# Patient Record
Sex: Female | Born: 1990 | Race: White | Hispanic: No | Marital: Single | Smoking: Never smoker
Health system: Southern US, Community
[De-identification: ages and names within clinical notes are randomized; demographics above are authoritative.]

## PROBLEM LIST (undated history)

## (undated) DIAGNOSIS — J45909 Unspecified asthma, uncomplicated: Secondary | ICD-10-CM

## (undated) HISTORY — PX: NO PAST SURGERIES: SHX2092

---

## 2019-11-10 ENCOUNTER — Emergency Department (HOSPITAL_COMMUNITY)
Admission: EM | Admit: 2019-11-10 | Discharge: 2019-11-10 | Disposition: A | Discharge status: Home / Self Care | Payer: Medicaid Other | Attending: Emergency Medicine | Admitting: Emergency Medicine

## 2019-11-10 ENCOUNTER — Encounter (HOSPITAL_COMMUNITY): Payer: Self-pay | Admitting: Emergency Medicine

## 2019-11-10 ENCOUNTER — Emergency Department (HOSPITAL_COMMUNITY): Payer: Medicaid Other

## 2019-11-10 ENCOUNTER — Other Ambulatory Visit: Payer: Self-pay

## 2019-11-10 DIAGNOSIS — J45909 Unspecified asthma, uncomplicated: Secondary | ICD-10-CM | POA: Insufficient documentation

## 2019-11-10 DIAGNOSIS — N390 Urinary tract infection, site not specified: Secondary | ICD-10-CM | POA: Diagnosis not present

## 2019-11-10 DIAGNOSIS — Z79899 Other long term (current) drug therapy: Secondary | ICD-10-CM | POA: Insufficient documentation

## 2019-11-10 DIAGNOSIS — E876 Hypokalemia: Secondary | ICD-10-CM

## 2019-11-10 DIAGNOSIS — R42 Dizziness and giddiness: Secondary | ICD-10-CM | POA: Diagnosis present

## 2019-11-10 HISTORY — DX: Unspecified asthma, uncomplicated: J45.909

## 2019-11-10 LAB — URINALYSIS, ROUTINE W REFLEX MICROSCOPIC
Bilirubin Urine: NEGATIVE
Glucose, UA: NEGATIVE mg/dL
Hgb urine dipstick: NEGATIVE
Ketones, ur: 5 mg/dL — AB
Nitrite: POSITIVE — AB
Protein, ur: NEGATIVE mg/dL
Specific Gravity, Urine: 1.012 (ref 1.005–1.030)
pH: 8 (ref 5.0–8.0)

## 2019-11-10 LAB — COMPREHENSIVE METABOLIC PANEL
ALT: 13 U/L (ref 0–44)
AST: 19 U/L (ref 15–41)
Albumin: 3.9 g/dL (ref 3.5–5.0)
Alkaline Phosphatase: 65 U/L (ref 38–126)
Anion gap: 13 (ref 5–15)
BUN: 9 mg/dL (ref 6–20)
CO2: 19 mmol/L — ABNORMAL LOW (ref 22–32)
Calcium: 9.5 mg/dL (ref 8.9–10.3)
Chloride: 102 mmol/L (ref 98–111)
Creatinine, Ser: 0.97 mg/dL (ref 0.44–1.00)
GFR calc Af Amer: >60 mL/min (ref >60)
GFR calc non Af Amer: >60 mL/min (ref >60)
Glucose, Bld: 130 mg/dL — ABNORMAL HIGH (ref 70–99)
Potassium: 2.5 mmol/L — CL (ref 3.5–5.1)
Sodium: 134 mmol/L — ABNORMAL LOW (ref 135–145)
Total Bilirubin: 0.2 mg/dL — ABNORMAL LOW (ref 0.3–1.2)
Total Protein: 7.3 g/dL (ref 6.5–8.1)

## 2019-11-10 LAB — CBC
HCT: 34.9 % — ABNORMAL LOW (ref 36.0–46.0)
Hemoglobin: 12.1 g/dL (ref 12.0–15.0)
MCH: 28.5 pg (ref 26.0–34.0)
MCHC: 34.7 g/dL (ref 30.0–36.0)
MCV: 82.1 fL (ref 80.0–100.0)
Platelets: 269 10*3/uL (ref 150–400)
RBC: 4.25 MIL/uL (ref 3.87–5.11)
RDW: 14.3 % (ref 11.5–15.5)
WBC: 8.4 10*3/uL (ref 4.0–10.5)
nRBC: 0 % (ref 0.0–0.2)

## 2019-11-10 LAB — LIPASE, BLOOD: Lipase: 26 U/L (ref 11–51)

## 2019-11-10 LAB — I-STAT BETA HCG BLOOD, ED (MC, WL, AP ONLY): I-stat hCG, quantitative: <5 m[IU]/mL (ref <5)

## 2019-11-10 LAB — MAGNESIUM: Magnesium: 1.8 mg/dL (ref 1.7–2.4)

## 2019-11-10 MED ORDER — LACTATED RINGERS IV BOLUS
1000.0000 mL | Freq: Once | INTRAVENOUS | Status: AC
  Administered 2019-11-10: 1000 mL via INTRAVENOUS

## 2019-11-10 MED ORDER — POTASSIUM CHLORIDE CRYS ER 20 MEQ PO TBCR: 40.0000 meq | EXTENDED_RELEASE_TABLET | Freq: Two times a day (BID) | ORAL | 0 refills | Status: AC

## 2019-11-10 MED ORDER — POTASSIUM CHLORIDE CRYS ER 20 MEQ PO TBCR
40.0000 meq | EXTENDED_RELEASE_TABLET | Freq: Once | ORAL | Status: AC
  Administered 2019-11-10: 40 meq via ORAL
  Filled 2019-11-10: qty 2

## 2019-11-10 MED ORDER — NITROFURANTOIN MONOHYD MACRO 100 MG PO CAPS: 100.0000 mg | ORAL_CAPSULE | Freq: Two times a day (BID) | ORAL | 0 refills | Status: AC

## 2019-11-10 NOTE — ED Triage Notes (Signed)
Patient moaning in triage stating she believes she is dehydrated because her muscles are cramping and now feeling nauseas. Patient also feeling short of breath for the past hour. Patient restless in triage.

## 2019-11-10 NOTE — ED Notes (Signed)
Pt returned from xray

## 2019-11-10 NOTE — ED Notes (Signed)
Pt transported to xray 

## 2019-11-10 NOTE — Discharge Instructions (Signed)
If you develop numbness or weakness, tingling, vomiting, fever, flank/back or abdominal pain, or any other new/concerning symptoms then return to the ER for evaluation.

## 2019-11-10 NOTE — ED Provider Notes (Signed)
[facility] EMERGENCY DEPARTMENT Provider Note   CSN: [id number] Arrival date & time: 11/10/19  1552     History   Chief Complaint Chief Complaint  Patient presents with  . Muscle Cramps  . Shortness of Breath    HPI Sonya Wilson is a 28 y.o. female.     HPI  28 year old female presents with lightheadedness and cramping.  About 30 minutes prior to arrival the patient was feeling lightheaded and feeling like she had a panic attack.  She has a hard time describing exactly what that means of there was some shortness of breath.  That part seems to be resolved.  At the time she also had cramping in her hands and feet bilaterally.  While drinking Gatorade in the waiting room that seems to be resolved as well.  She states she has not eaten and drank well for about a week due to stressors at home.  Thinks she is dehydrated.  Denies any chest pain, headache, focal weakness.  No recent illness otherwise.  No cough.  Past Medical History:  Diagnosis Date  . Asthma     There are no active problems to display for this patient.   History reviewed. No pertinent surgical history.   OB History   No obstetric history on file.      Home Medications    Prior to Admission medications   Medication Sig Start Date End Date Taking? Authorizing Provider  ALPRAZolam Prudy Feeler(XANAX) 0.25 MG tablet Take 0.25 mg by mouth at bedtime as needed for anxiety.   Yes [provider]  lisdexamfetamine (VYVANSE) 50 MG capsule Take 50 mg by mouth daily.   Yes [provider]  SUMAtriptan (IMITREX) 50 MG tablet Take 50 mg by mouth every 2 (two) hours as needed for migraine. May repeat in 2 hours if headache persists or recurs.   Yes [provider]  venlafaxine (EFFEXOR) 75 MG tablet Take 75 mg by mouth 3 (three) times daily with meals.   Yes [provider]  nitrofurantoin, macrocrystal-monohydrate, (MACROBID) 100 MG capsule Take 1 capsule (100 mg total) by  mouth 2 (two) times daily. 11/10/19   Pricilla LovelessGoldston, Elexus Barman, MD  potassium chloride SA (KLOR-CON) 20 MEQ tablet Take 2 tablets (40 mEq total) by mouth 2 (two) times daily for 5 days. 11/10/19 11/15/19  Pricilla LovelessGoldston, Anavi Branscum, MD    Family History No family history on file.  Social History Social History   Tobacco Use  . Smoking status: Never Smoker  . Smokeless tobacco: Never Used  Substance Use Topics  . Alcohol use: Not on file  . Drug use: Not on file     Allergies   Azithromycin   Review of Systems Review of Systems  Constitutional: Negative for fever.  Respiratory: Positive for shortness of breath. Negative for cough.   Cardiovascular: Negative for chest pain.  Gastrointestinal: Negative for abdominal pain, diarrhea and vomiting.  Musculoskeletal: Positive for myalgias.  Neurological: Negative for weakness and numbness.  All other systems reviewed and are negative.    Physical Exam Updated Vital Signs BP 112/84 (BP Location: Left Arm)   Pulse 87   Temp 98.2 F (36.8 C) (Oral)   Resp 16   LMP 11/10/2019   SpO2 100%   Physical Exam Vitals signs and nursing note reviewed.  Constitutional:      General: She is not in acute distress.    Appearance: She is well-developed. She is not ill-appearing or diaphoretic.  HENT:  Head: Normocephalic and atraumatic.     Right Ear: External ear normal.     Left Ear: External ear normal.     Nose: Nose normal.  Eyes:     General:        Right eye: No discharge.        Left eye: No discharge.     Extraocular Movements: Extraocular movements intact.  Cardiovascular:     Rate and Rhythm: Normal rate and regular rhythm.     Heart sounds: Normal heart sounds.  Pulmonary:     Effort: Pulmonary effort is normal.     Breath sounds: Normal breath sounds.  Abdominal:     Palpations: Abdomen is soft.     Tenderness: There is no abdominal tenderness.  Musculoskeletal:     Comments: No focal muscle tenderness in large muscle groups   Skin:    General: Skin is warm and dry.  Neurological:     Mental Status: She is alert.     Comments: CN 3-12 grossly intact. 5/5 strength in all 4 extremities. Grossly normal sensation. Normal finger to nose.   Psychiatric:        Mood and Affect: Mood is not anxious.      ED Treatments / Results  Labs (all labs ordered are listed, but only abnormal results are displayed) Labs Reviewed  COMPREHENSIVE METABOLIC PANEL - Abnormal; Notable for the following components:      Result Value   Sodium 134 (*)    Potassium 2.5 (*)    CO2 19 (*)    Glucose, Bld 130 (*)    Total Bilirubin 0.2 (*)    All other components within normal limits  CBC - Abnormal; Notable for the following components:   HCT 34.9 (*)    All other components within normal limits  URINALYSIS, ROUTINE W REFLEX MICROSCOPIC - Abnormal; Notable for the following components:   APPearance HAZY (*)    Ketones, ur 5 (*)    Nitrite POSITIVE (*)    Leukocytes,Ua SMALL (*)    Bacteria, UA MANY (*)    All other components within normal limits  URINE CULTURE  LIPASE, BLOOD  MAGNESIUM  I-STAT BETA HCG BLOOD, ED ([facility], [facility], [facility] ONLY)    EKG EKG Interpretation  Date/Time:  Wednesday November 10 2019 17:47:52 EST Ventricular Rate:  102 PR Interval:  144 QRS Duration: 78 QT Interval:  354 QTC Calculation: 461 R Axis:   74 Text Interpretation: Sinus tachycardia nonspecific ST/T segments No old tracing to compare Confirmed by Sherwood Gambler [phone]) on 11/10/2019 5:57:57 PM   Radiology Dg Chest 2 View  Result Date: 11/10/2019 CLINICAL DATA:  Shortness of breath EXAM: CHEST - 2 VIEW COMPARISON:  05/14/2016 FINDINGS: The heart size and mediastinal contours are within normal limits. Both lungs are clear. The visualized skeletal structures are unremarkable. IMPRESSION: No acute abnormality of the lungs. Electronically Signed   By: Eddie Candle M.D.   On: 11/10/2019 17:19    Procedures Procedures (including critical care  time)  Medications Ordered in ED Medications  potassium chloride SA (KLOR-CON) CR tablet 40 mEq (40 mEq Oral Given 11/10/19 1814)  lactated ringers bolus 1,000 mL (0 mLs Intravenous Stopped 11/10/19 1946)     Initial Impression / Assessment and Plan / ED Course  I have reviewed the triage vital signs and the nursing notes.  Pertinent labs & imaging results that were available during my care of the patient were reviewed by me and considered in my  medical decision making (see chart for details).        Patient is found to have hypokalemia to 2.5, which could explain the cramps she was experiencing.  She was given IV fluids as well as oral potassium replacement.  QTC is okay.  Magnesium is okay.  Other labs are not significantly abnormal.  She does have concerning urinalysis for possible UTI and will give Macrobid and sent for culture.  Otherwise, she appears stable for discharge home with oral potassium replacement.  My suspicion this is ACS, PE, focal neurologic cause is pretty low.  Return precautions.  Final Clinical Impressions(s) / ED Diagnoses   Final diagnoses:  Hypokalemia  Acute lower UTI (urinary tract infection)    ED Discharge Orders         Ordered    nitrofurantoin, macrocrystal-monohydrate, (MACROBID) 100 MG capsule  2 times daily     11/10/19 1938    potassium chloride SA (KLOR-CON) 20 MEQ tablet  2 times daily     11/10/19 1938           Pricilla Loveless, MD 11/10/19 2045

## 2019-11-13 LAB — URINE CULTURE: Culture: >=100000 — AB

## 2019-11-14 ENCOUNTER — Telehealth: Payer: Self-pay | Admitting: Emergency Medicine

## 2019-11-14 NOTE — Telephone Encounter (Signed)
Post ED Visit - Positive Culture Follow-up  Culture report reviewed by antimicrobial stewardship pharmacist: [place] Team []  Elenor Quinones, Pharm.D. []  Heide Guile, Pharm.D., BCPS AQ-ID []  Parks Neptune, Pharm.D., BCPS []  Alycia Rossetti, Pharm.D., BCPS []  [place], Pharm.D., BCPS, AAHIVP []  Legrand Como, Pharm.D., BCPS, AAHIVP []  Salome Arnt, PharmD, BCPS []  Johnnette Gourd, PharmD, BCPS []  Hughes Better, PharmD, BCPS [x]  Duanne Limerick, PharmD []  Laqueta Linden, PharmD, BCPS []  Albertina Parr, PharmD  [place] Team []  Leodis Sias, PharmD []  Lindell Spar, PharmD []  Royetta Asal, PharmD []  Graylin Shiver, Rph []  Rema Fendt) Glennon Mac, PharmD []  Arlyn Dunning, PharmD []  Netta Cedars, PharmD []  Dia Sitter, PharmD []  Leone Haven, PharmD []  Gretta Arab, PharmD []  Theodis Shove, PharmD []  Peggyann Juba, PharmD []  Reuel Boom, PharmD   Positive urine culture Treated with Nitrofurantoin, organism sensitive to the same and no further patient follow-up is required at this time.  Sandi Raveling Special Ranes 11/14/2019, 3:59 PM

## 2020-03-18 ENCOUNTER — Other Ambulatory Visit: Payer: Self-pay

## 2020-03-18 ENCOUNTER — Inpatient Hospital Stay (HOSPITAL_COMMUNITY)
Admission: AD | Admit: 2020-03-18 | Discharge: 2020-03-18 | Disposition: A | Discharge status: Home / Self Care | Payer: Medicaid Other | Attending: Obstetrics & Gynecology | Admitting: Obstetrics & Gynecology

## 2020-03-18 ENCOUNTER — Encounter (HOSPITAL_COMMUNITY): Payer: Self-pay | Admitting: Obstetrics & Gynecology

## 2020-03-18 ENCOUNTER — Inpatient Hospital Stay (HOSPITAL_COMMUNITY): Payer: Medicaid Other

## 2020-03-18 DIAGNOSIS — O26893 Other specified pregnancy related conditions, third trimester: Secondary | ICD-10-CM | POA: Diagnosis not present

## 2020-03-18 DIAGNOSIS — O209 Hemorrhage in early pregnancy, unspecified: Secondary | ICD-10-CM | POA: Diagnosis not present

## 2020-03-18 DIAGNOSIS — Z881 Allergy status to other antibiotic agents status: Secondary | ICD-10-CM | POA: Diagnosis not present

## 2020-03-18 DIAGNOSIS — O3680X0 Pregnancy with inconclusive fetal viability, not applicable or unspecified: Secondary | ICD-10-CM | POA: Diagnosis not present

## 2020-03-18 DIAGNOSIS — Z3A01 Less than 8 weeks gestation of pregnancy: Secondary | ICD-10-CM

## 2020-03-18 DIAGNOSIS — R197 Diarrhea, unspecified: Secondary | ICD-10-CM | POA: Insufficient documentation

## 2020-03-18 LAB — CBC
HCT: 35.1 % — ABNORMAL LOW (ref 36.0–46.0)
Hemoglobin: 11.6 g/dL — ABNORMAL LOW (ref 12.0–15.0)
MCH: 28 pg (ref 26.0–34.0)
MCHC: 33 g/dL (ref 30.0–36.0)
MCV: 84.8 fL (ref 80.0–100.0)
Platelets: 291 10*3/uL (ref 150–400)
RBC: 4.14 MIL/uL (ref 3.87–5.11)
RDW: 15 % (ref 11.5–15.5)
WBC: 9.4 10*3/uL (ref 4.0–10.5)
nRBC: 0 % (ref 0.0–0.2)

## 2020-03-18 LAB — WET PREP, GENITAL
Clue Cells Wet Prep HPF POC: NONE SEEN
Sperm: NONE SEEN
Trich, Wet Prep: NONE SEEN
Yeast Wet Prep HPF POC: NONE SEEN

## 2020-03-18 LAB — HIV ANTIBODY (ROUTINE TESTING W REFLEX): HIV Screen 4th Generation wRfx: NONREACTIVE

## 2020-03-18 LAB — URINALYSIS, ROUTINE W REFLEX MICROSCOPIC
Bacteria, UA: NONE SEEN
Bilirubin Urine: NEGATIVE
Glucose, UA: NEGATIVE mg/dL
Ketones, ur: NEGATIVE mg/dL
Leukocytes,Ua: NEGATIVE
Nitrite: NEGATIVE
Protein, ur: 30 mg/dL — AB
Specific Gravity, Urine: 1.01 (ref 1.005–1.030)
pH: 6 (ref 5.0–8.0)

## 2020-03-18 LAB — POCT PREGNANCY, URINE: Preg Test, Ur: POSITIVE — AB

## 2020-03-18 LAB — HCG, QUANTITATIVE, PREGNANCY: hCG, Beta Chain, Quant, S: 2151 m[IU]/mL — ABNORMAL HIGH (ref <5)

## 2020-03-18 NOTE — MAU Provider Note (Addendum)
Chief Complaint: Vaginal Bleeding and Diarrhea   First Provider Initiated Contact with Patient 03/18/20 1525      SUBJECTIVE HPI: Sonya Wilson is a 29 y.o. G3P2000 at [redacted]w[redacted]d by LMP who presents to maternity admissions reporting onset of diarrhea early this morning around 4 am followed by vaginal bleeding when wiping.  She did not have an appetite last night for dinner but drank apple and cranberry juice before going to bed. She woke up with her stomach "gurgling" then started the diarrhea. Then, 1-2 hours later while at work, she saw bleeding when wiping that was bright red.  It has not required a pad. There is cramping associated with the bleeding.  The diarrhea has completely resolved prior to arrival in MAU.  There are no other symptoms. She has not tried any treatments.  She denies vaginal bleeding, vaginal itching/burning, urinary symptoms, h/a, dizziness, n/v, or fever/chills.     HPI  Past Medical History:  Diagnosis Date  . Asthma    Past Surgical History:  Procedure Laterality Date  . NO PAST SURGERIES     Social History   Socioeconomic History  . Marital status: Single    Spouse name: Not on file  . Number of children: Not on file  . Years of education: Not on file  . Highest education level: Not on file  Occupational History  . Not on file  Tobacco Use  . Smoking status: Never Smoker  . Smokeless tobacco: Never Used  Substance and Sexual Activity  . Alcohol use: Not Currently  . Drug use: Never  . Sexual activity: Yes    Birth control/protection: None  Other Topics Concern  . Not on file  Social History Narrative  . Not on file   Social Determinants of Health   Financial Resource Strain:   . Difficulty of Paying Living Expenses:   Food Insecurity:   . Worried About Programme researcher, broadcasting/film/video in the Last Year:   . Barista in the Last Year:   Transportation Needs:   . Freight forwarder (Medical):   Marland Kitchen Lack of Transportation (Non-Medical):   Physical  Activity:   . Days of Exercise per Week:   . Minutes of Exercise per Session:   Stress:   . Feeling of Stress :   Social Connections:   . Frequency of Communication with Friends and Family:   . Frequency of Social Gatherings with Friends and Family:   . Attends Religious Services:   . Active Member of Clubs or Organizations:   . Attends Banker Meetings:   Marland Kitchen Marital Status:   Intimate Partner Violence:   . Fear of Current or Ex-Partner:   . Emotionally Abused:   Marland Kitchen Physically Abused:   . Sexually Abused:    No current facility-administered medications on file prior to encounter.   Current Outpatient Medications on File Prior to Encounter  Medication Sig Dispense Refill  . lisdexamfetamine (VYVANSE) 50 MG capsule Take 50 mg by mouth daily.    . nitrofurantoin, macrocrystal-monohydrate, (MACROBID) 100 MG capsule Take 1 capsule (100 mg total) by mouth 2 (two) times daily. 10 capsule 0  . potassium chloride SA (KLOR-CON) 20 MEQ tablet Take 2 tablets (40 mEq total) by mouth 2 (two) times daily for 5 days. 20 tablet 0  . SUMAtriptan (IMITREX) 50 MG tablet Take 50 mg by mouth every 2 (two) hours as needed for migraine. May repeat in 2 hours if headache persists or recurs.    Marland Kitchen  venlafaxine (EFFEXOR) 75 MG tablet Take 75 mg by mouth 3 (three) times daily with meals.     Allergies  Allergen Reactions  . Azithromycin Hives    ROS:  Review of Systems  Constitutional: Negative for chills and fever.  Respiratory: Negative for cough and shortness of breath.   Cardiovascular: Negative for chest pain.  Gastrointestinal: Positive for diarrhea. Negative for constipation, nausea and vomiting.  Genitourinary: Positive for vaginal bleeding. Negative for dysuria, frequency and urgency.  Musculoskeletal: Negative.   Neurological: Negative for dizziness and headaches.     I have reviewed patient's Past Medical Hx, Surgical Hx, Family Hx, Social Hx, medications and allergies.    Physical Exam   Patient Vitals for the past 24 hrs:  BP Temp Temp src Pulse Resp SpO2 Height Weight  03/18/20 1842 -- -- -- -- 16 -- -- --  03/18/20 1432 118/73 98.7 F (37.1 C) Oral 80 16 100 % 5\' 2"  (1.575 m) 68.8 kg   Constitutional: Well-developed, well-nourished female in no acute distress.  Cardiovascular: normal rate Respiratory: normal effort GI: Abd soft, non-tender. Pos BS x 4 MS: Extremities nontender, no edema, normal ROM Neurologic: Alert and oriented x 4.  GU: Neg CVAT.  PELVIC EXAM: Cervix pink, visually closed, without lesion, scant dark red bleeding with one small clot, vaginal walls and external genitalia normal Bimanual exam: Cervix 0/long/high, firm, anterior, neg CMT, uterus nontender, nonenlarged, adnexa without tenderness, enlargement, or mass   LAB RESULTS Results for orders placed or performed during the hospital encounter of 03/18/20 (from the past 24 hour(s))  Pregnancy, urine POC     Status: Abnormal   Collection Time: 03/18/20  2:23 PM  Result Value Ref Range   Preg Test, Ur POSITIVE (A) NEGATIVE  Urinalysis, Routine w reflex microscopic     Status: Abnormal   Collection Time: 03/18/20  2:24 PM  Result Value Ref Range   Color, Urine YELLOW YELLOW   APPearance CLEAR CLEAR   Specific Gravity, Urine 1.010 1.005 - 1.030   pH 6.0 5.0 - 8.0   Glucose, UA NEGATIVE NEGATIVE mg/dL   Hgb urine dipstick LARGE (A) NEGATIVE   Bilirubin Urine NEGATIVE NEGATIVE   Ketones, ur NEGATIVE NEGATIVE mg/dL   Protein, ur 30 (A) NEGATIVE mg/dL   Nitrite NEGATIVE NEGATIVE   Leukocytes,Ua NEGATIVE NEGATIVE   RBC / HPF 21-50 0 - 5 RBC/hpf   WBC, UA 6-10 0 - 5 WBC/hpf   Bacteria, UA NONE SEEN NONE SEEN   Squamous Epithelial / LPF 0-5 0 - 5   Mucus PRESENT   Wet prep, genital     Status: Abnormal   Collection Time: 03/18/20  3:35 PM   Specimen: Vaginal  Result Value Ref Range   Yeast Wet Prep HPF POC NONE SEEN NONE SEEN   Trich, Wet Prep NONE SEEN NONE SEEN    Clue Cells Wet Prep HPF POC NONE SEEN NONE SEEN   WBC, Wet Prep HPF POC FEW (A) NONE SEEN   Sperm NONE SEEN   CBC     Status: Abnormal   Collection Time: 03/18/20  5:12 PM  Result Value Ref Range   WBC 9.4 4.0 - 10.5 K/uL   RBC 4.14 3.87 - 5.11 MIL/uL   Hemoglobin 11.6 (L) 12.0 - 15.0 g/dL   HCT 03/20/20 (L) 28.7 - 86.7 %   MCV 84.8 80.0 - 100.0 fL   MCH 28.0 26.0 - 34.0 pg   MCHC 33.0 30.0 - 36.0 g/dL   RDW  15.0 11.5 - 15.5 %   Platelets 291 150 - 400 K/uL   nRBC 0.0 0.0 - 0.2 %  hCG, quantitative, pregnancy     Status: Abnormal   Collection Time: 03/18/20  5:12 PM  Result Value Ref Range   hCG, Beta Chain, Quant, S 2,151 (H) <5 mIU/mL  HIV Antibody (routine testing w rflx)     Status: None   Collection Time: 03/18/20  5:12 PM  Result Value Ref Range   HIV Screen 4th Generation wRfx NON REACTIVE NON REACTIVE  ABO/Rh     Status: None   Collection Time: 03/18/20  5:12 PM  Result Value Ref Range   ABO/RH(D)      O POS Performed at [facility], [address] [place], [place] [address]     --/--/O POS Performed at [facility], [address] [place], [place] [address]  [phone])  IMAGING US OB LESS THAN 14 WEEKS WITH OB TRANSVAGINAL  Result Date: 03/18/2020 CLINICAL DATA:  29 year old pregnant female presenting with vaginal bleeding. LMP: 02/08/2020 corresponding to an estimated gestational age of [redacted] weeks, 4 days. EXAM: OBSTETRIC <14 WK [place] AND TRANSVAGINAL OB US TECHNIQUE: Both transabdominal and transvaginal ultrasound examinations were performed for complete evaluation of the gestation as well as the maternal uterus, adnexal regions, and pelvic cul-de-sac. Transvaginal technique was performed to assess early pregnancy. COMPARISON:  None. FINDINGS: There is a sac-like structure within the right fundal endometrium. No yolk sac or fetal pole identified within this structure. There is apparent decidual reaction around this structure. This likely represents an early  gestational sac. A blighted ovum or a pseudo gestation of an ectopic pregnancy not entirely excluded clinical correlation and follow-up recommended. If this cystic structure is a true gestational sac, the estimated gestational age based on mean sac diameter of 5 mm is 5 weeks, 1 day. Subchorionic hemorrhage:  None visualized. Maternal uterus/adnexae: The ovaries are unremarkable. There is a corpus luteum in the right ovary. IMPRESSION: Small sac-like structure within the endometrium, likely an early gestational sac. Clinical correlation and follow-up with ultrasound in 7-11 days, or earlier if clinically indicated, recommended. Electronically Signed   By: Elgie Collard M.D.   On: 03/18/2020 16:38    MAU Management/MDM: Orders Placed This Encounter  Procedures  . Wet prep, genital  . US OB LESS THAN 14 WEEKS WITH OB TRANSVAGINAL  . Urinalysis, Routine w reflex microscopic  . CBC  . hCG, quantitative, pregnancy  . HIV Antibody (routine testing w rflx)  . Contact Isolation: Enteric  . Pregnancy, urine POC  . ABO/Rh  . Discharge patient    No orders of the defined types were placed in this encounter.   Findings today could represent a normal early pregnancy, spontaneous abortion or ectopic pregnancy which can be life-threatening.  Ectopic precautions were given to the patient with plan to return in 48 hours for repeat quant hcg to evaluate pregnancy development.  Appt made at [facility] for stat hcg in 2 days.  Pt discharged with strict ectopic/bleeding precautions.  ASSESSMENT 1. Pregnancy of unknown anatomic location   2. Vaginal bleeding in pregnancy, first trimester     PLAN Discharge home Allergies as of 03/18/2020      Reactions   Azithromycin Hives      Medication List    STOP taking these medications   ALPRAZolam 0.25 MG tablet Commonly known as: XANAX     TAKE these medications   lisdexamfetamine 50 MG  capsule Commonly known as: VYVANSE Take 50 mg by mouth daily.    nitrofurantoin (macrocrystal-monohydrate) 100 MG capsule Commonly known as: MACROBID Take 1 capsule (100 mg total) by mouth 2 (two) times daily.   potassium chloride SA 20 MEQ tablet Commonly known as: KLOR-CON Take 2 tablets (40 mEq total) by mouth 2 (two) times daily for 5 days.   SUMAtriptan 50 MG tablet Commonly known as: IMITREX Take 50 mg by mouth every 2 (two) hours as needed for migraine. May repeat in 2 hours if headache persists or recurs.   venlafaxine 75 MG tablet Commonly known as: EFFEXOR Take 75 mg by mouth 3 (three) times daily with meals.      Follow-up [place] for [facility] Follow up.   Specialty: Obstetrics and Gynecology Why: On Monday 03/21/19 at 2 pm.  Return to MAU for worsening symptoms.  Contact information: [address], [place] [id number] [place] [id number] [facility] Follow up.   Specialty: Obstetrics and Gynecology Why: For emergencies.  Call as needed for results. Contact information: [address] [id number] [place]          Fatima Blank Certified Nurse-Midwife 03/18/2020  7:11 PM

## 2020-03-18 NOTE — MAU Note (Signed)
Sonya Wilson is a 29 y.o. at [redacted]w[redacted]d here in MAU reporting: woke up around 0300 and her stomach was gurgling and she had diarrhea. States it has "died off a tad". When she was wiping she saw bleeding. States at first she just saw the blood when she wiping but then when she was at work she could see the blood coming out. intermittent cramping, none currently.   LMP: 02/08/20  Onset of complaint: today  Pain score: 0/10  Vitals:   03/18/20 1432  BP: 118/73  Pulse: 80  Resp: 16  Temp: 98.7 F (37.1 C)  SpO2: 100%     Lab orders placed from triage: UA, UPT

## 2020-03-20 ENCOUNTER — Other Ambulatory Visit: Payer: Self-pay

## 2020-03-20 ENCOUNTER — Ambulatory Visit: Payer: Medicaid Other | Admitting: *Deleted

## 2020-03-20 VITALS — BP 109/64 | HR 103 | Temp 98.5°F | Ht 62.0 in | Wt 151.0 lb

## 2020-03-20 DIAGNOSIS — O3680X0 Pregnancy with inconclusive fetal viability, not applicable or unspecified: Secondary | ICD-10-CM

## 2020-03-20 LAB — GC/CHLAMYDIA PROBE AMP (~~LOC~~) NOT AT ARMC
Chlamydia: NEGATIVE
Comment: NEGATIVE
Comment: NORMAL
Neisseria Gonorrhea: NEGATIVE

## 2020-03-20 LAB — BETA HCG QUANT (REF LAB): hCG Quant: 3227 m[IU]/mL

## 2020-03-20 LAB — ABO/RH: ABO/RH(D): O POS

## 2020-03-20 NOTE — Progress Notes (Signed)
Pt here for f/u of pregnancy of unknown anatomic location after MAU visit on 4/17. She reports having less diarrhea. She is having a small amount of red/brown vaginal discharge. She denies abdominal pain except when she is having an episode of diarrhea. Stat BHCG drawn. Pt advised that she will be called with results today - in approximately 2 hrs. Py voiced understanding and stated that a detailed message can be left on her VM or she can receive results via MyChart.    1700  BHCG results received from lab - 3227. Dr. Debroah Loop informed of results. He recommends pt to have repeat stat BHCG on 4/21. Future [place] may be needed based on those results. Pt was called and informed of results and plan of care. She agreed and voiced understanding. Pt will be notified of appt on 4/21 via MyChart.

## 2020-03-22 ENCOUNTER — Other Ambulatory Visit: Payer: Self-pay

## 2020-03-22 ENCOUNTER — Ambulatory Visit (INDEPENDENT_AMBULATORY_CARE_PROVIDER_SITE_OTHER): Payer: Medicaid Other | Admitting: *Deleted

## 2020-03-22 VITALS — BP 117/73 | HR 83

## 2020-03-22 DIAGNOSIS — O209 Hemorrhage in early pregnancy, unspecified: Secondary | ICD-10-CM

## 2020-03-22 DIAGNOSIS — O3680X0 Pregnancy with inconclusive fetal viability, not applicable or unspecified: Secondary | ICD-10-CM

## 2020-03-22 LAB — BETA HCG QUANT (REF LAB): hCG Quant: 5023 m[IU]/mL

## 2020-03-22 NOTE — Progress Notes (Signed)
Lab drawn for stat BHCG.  Pt reports she has continued to have some vaginal spotting - mostly brown, occasionally is red. She also continues to have occasional mild cramping. Pt advised she will be called with results and plan of care later today. She voiced understanding and stated that a detailed message can be left on her VM if she is not able to answer the call.   1630  BHCG results (5023) reviewed w/Dr. Constant. She recommends follow up US next week. I called pt and left message of her hormone result. I stated that even though the level did not double, it has still increased. She has been scheduled for [place] on 4/27 @ 1100 and will receive the results on the same Florena Kozma. Pt was reminded to go to MAU for evaluation if her bleeding becomes heavy or if she has increased abdominal/pelvic pain. She may call back or send a MyChart message if she has questions.

## 2020-03-23 NOTE — Progress Notes (Signed)
Patient seen and assessed by nursing staff during this encounter. I have reviewed the chart and agree with the documentation and plan. I have also made any necessary editorial changes.  Ladora Osterberg, MD 03/23/2020 8:30 AM    

## 2020-03-24 ENCOUNTER — Other Ambulatory Visit: Payer: Self-pay | Admitting: Obstetrics and Gynecology

## 2020-03-24 MED ORDER — BUTALBITAL-APAP-CAFFEINE 50-325-40 MG PO CAPS: 1.0000 | ORAL_CAPSULE | Freq: Four times a day (QID) | ORAL | 3 refills | Status: AC | PRN

## 2020-03-27 ENCOUNTER — Inpatient Hospital Stay (HOSPITAL_COMMUNITY)
Admission: AD | Admit: 2020-03-27 | Discharge: 2020-03-27 | Disposition: A | Discharge status: Home / Self Care | Payer: Medicaid Other | Attending: Obstetrics & Gynecology | Admitting: Obstetrics & Gynecology

## 2020-03-27 ENCOUNTER — Inpatient Hospital Stay (HOSPITAL_COMMUNITY): Payer: Medicaid Other

## 2020-03-27 ENCOUNTER — Other Ambulatory Visit: Payer: Self-pay

## 2020-03-27 ENCOUNTER — Encounter (HOSPITAL_COMMUNITY): Payer: Self-pay | Admitting: Obstetrics & Gynecology

## 2020-03-27 DIAGNOSIS — O209 Hemorrhage in early pregnancy, unspecified: Secondary | ICD-10-CM | POA: Diagnosis not present

## 2020-03-27 DIAGNOSIS — Z881 Allergy status to other antibiotic agents status: Secondary | ICD-10-CM | POA: Diagnosis not present

## 2020-03-27 DIAGNOSIS — Z6790 Unspecified blood type, Rh positive: Secondary | ICD-10-CM

## 2020-03-27 DIAGNOSIS — Z3490 Encounter for supervision of normal pregnancy, unspecified, unspecified trimester: Secondary | ICD-10-CM

## 2020-03-27 DIAGNOSIS — Z3A01 Less than 8 weeks gestation of pregnancy: Secondary | ICD-10-CM | POA: Diagnosis not present

## 2020-03-27 DIAGNOSIS — O4690 Antepartum hemorrhage, unspecified, unspecified trimester: Secondary | ICD-10-CM

## 2020-03-27 DIAGNOSIS — O3680X0 Pregnancy with inconclusive fetal viability, not applicable or unspecified: Secondary | ICD-10-CM

## 2020-03-27 LAB — URINALYSIS, ROUTINE W REFLEX MICROSCOPIC
Bilirubin Urine: NEGATIVE
Glucose, UA: NEGATIVE mg/dL
Ketones, ur: NEGATIVE mg/dL
Nitrite: NEGATIVE
Protein, ur: 100 mg/dL — AB
Specific Gravity, Urine: 1.024 (ref 1.005–1.030)
pH: 6 (ref 5.0–8.0)

## 2020-03-27 LAB — HCG, QUANTITATIVE, PREGNANCY: hCG, Beta Chain, Quant, S: 14608 m[IU]/mL — ABNORMAL HIGH (ref <5)

## 2020-03-27 NOTE — MAU Note (Signed)
Pt presents to MAU with c/o vaginal bleeding that has been intermittent x 1 week. She describes it as spotting, but today she had a clot with bright red bleeding. She denies pain.

## 2020-03-27 NOTE — MAU Provider Note (Signed)
History     CSN: [id number]  Arrival date and time: 03/27/20 0093   First Provider Initiated Contact with Patient 03/27/20 1053      Chief Complaint  Patient presents with  . Vaginal Bleeding   Ms. Sonya Wilson is a 29 y.o. G3P2002 at [redacted]w[redacted]d who presents to MAU for vaginal bleeding which began one week ago and is intermittent. Patient reports she was here in MAU one week ago for vaginal bleeding and diarrhea. Patient reports she only saw bleeding when she wiped when she initially came to MAU and denies clotting. Patient reports this past Saturday and Sunday she had no bleeding, but this morning she went to use the bathroom and "felt something come out" found a small clot that she brought with her to MAU. Patient reports bleeding on arrival to MAU. Patient reports she was being followed for hCG that was 2151 --> 3227 --> 5023. Patient is scheduled for an [place] tomorrow.  Passing blood clots? yes, per above Blood soaking clothes? no Lightheaded/dizzy? no Significant pelvic pain or cramping? no Passed any tissue? no  Current pregnancy problems? Pt has not yet been seen Blood Type? O Positive Allergies? Azithromycin Current medications? Fioricet PRN, Effexor Current PNC & next appt? Pt requests list of OB Providers  Pt denies vaginal discharge/odor/itching. Pt denies N/V, abdominal pain, constipation, diarrhea, or urinary problems. Pt denies fever, chills, fatigue, sweating or changes in appetite. Pt denies SOB or chest pain. Pt denies dizziness, HA, light-headedness, weakness.   OB History    Gravida  3   Para  2   Term  2   Preterm      AB      Living  2     SAB      TAB      Ectopic      Multiple      Live Births  2           Past Medical History:  Diagnosis Date  . Asthma     Past Surgical History:  Procedure Laterality Date  . NO PAST SURGERIES      Family History  Problem Relation Age of Onset  . COPD Mother   . Cancer Mother     Social  History   Tobacco Use  . Smoking status: Never Smoker  . Smokeless tobacco: Never Used  Substance Use Topics  . Alcohol use: Not Currently  . Drug use: Never    Allergies:  Allergies  Allergen Reactions  . Azithromycin Hives    Medications Prior to Admission  Medication Sig Dispense Refill Last Dose  . Butalbital-APAP-Caffeine 50-325-40 MG capsule Take 1-2 capsules by mouth every 6 (six) hours as needed for headache. 30 capsule 3 Past Week at Unknown time  . venlafaxine (EFFEXOR) 75 MG tablet Take 75 mg by mouth 3 (three) times daily with meals.   Past Week at Unknown time  . lisdexamfetamine (VYVANSE) 50 MG capsule Take 50 mg by mouth daily.     . nitrofurantoin, macrocrystal-monohydrate, (MACROBID) 100 MG capsule Take 1 capsule (100 mg total) by mouth 2 (two) times daily. 10 capsule 0   . potassium chloride SA (KLOR-CON) 20 MEQ tablet Take 2 tablets (40 mEq total) by mouth 2 (two) times daily for 5 days. 20 tablet 0   . SUMAtriptan (IMITREX) 50 MG tablet Take 50 mg by mouth every 2 (two) hours as needed for migraine. May repeat in 2 hours if headache persists or recurs.  Review of Systems  Constitutional: Negative for chills, diaphoresis, fatigue and fever.  Eyes: Negative for visual disturbance.  Respiratory: Negative for shortness of breath.   Cardiovascular: Negative for chest pain.  Gastrointestinal: Negative for abdominal pain, constipation, diarrhea, nausea and vomiting.  Genitourinary: Positive for vaginal bleeding. Negative for dysuria, flank pain, frequency, pelvic pain, urgency and vaginal discharge.  Neurological: Negative for dizziness, weakness, light-headedness and headaches.   Physical Exam   Blood pressure 110/63, pulse 92, temperature 98.2 F (36.8 C), temperature source Oral, resp. rate 18, weight 70.8 kg, last menstrual period 02/08/2020, SpO2 100 %.  Patient Vitals for the past 24 hrs:  BP Temp Temp src Pulse Resp SpO2 Weight  03/27/20 1003 110/63  98.2 F (36.8 C) Oral 92 18 100 % 70.8 kg   Physical Exam  Constitutional: She is oriented to person, place, and time. She appears well-developed and well-nourished. No distress.  HENT:  Head: Normocephalic and atraumatic.  Respiratory: Effort normal.  Neurological: She is alert and oriented to person, place, and time.  Skin: She is not diaphoretic.  Psychiatric: She has a normal mood and affect. Her behavior is normal. Judgment and thought content normal.  Patient declines pelvic exam.  Results for orders placed or performed during the hospital encounter of 03/27/20 (from the past 24 hour(s))  Urinalysis, Routine w reflex microscopic     Status: Abnormal   Collection Time: 03/27/20 10:00 AM  Result Value Ref Range   Color, Urine AMBER (A) YELLOW   APPearance HAZY (A) CLEAR   Specific Gravity, Urine 1.024 1.005 - 1.030   pH 6.0 5.0 - 8.0   Glucose, UA NEGATIVE NEGATIVE mg/dL   Hgb urine dipstick LARGE (A) NEGATIVE   Bilirubin Urine NEGATIVE NEGATIVE   Ketones, ur NEGATIVE NEGATIVE mg/dL   Protein, ur 588 (A) NEGATIVE mg/dL   Nitrite NEGATIVE NEGATIVE   Leukocytes,Ua TRACE (A) NEGATIVE  hCG, quantitative, pregnancy     Status: Abnormal   Collection Time: 03/27/20 10:38 AM  Result Value Ref Range   hCG, Beta Chain, Quant, S 14,608 (H) <5 mIU/mL   US OB Transvaginal  Result Date: 03/27/2020 CLINICAL DATA:  Vaginal bleeding for 1 week EXAM: TRANSVAGINAL OB ULTRASOUND TECHNIQUE: Transvaginal ultrasound was performed for complete evaluation of the gestation as well as the maternal uterus, adnexal regions, and pelvic cul-de-sac. COMPARISON:  03/18/2020 FINDINGS: Intrauterine gestational sac: Single Yolk sac:  Not visualized Embryo:  Possible early embryo. Cardiac Activity: Not visualized Heart Rate:  bpm MSD:   mm    w     d CRL:   4.2 mm   6 w 1 d                  [place] EDC: 11/19/2020 Subchorionic hemorrhage:  None visualized. Maternal uterus/adnexae: No adnexal mass or free fluid.  IMPRESSION: Intrauterine gestation with probable early fetal pole, 6 weeks 1 day by crown-rump length. No fetal heart tones detected currently, likely related to early gestational age. This could be followed with repeat ultrasound in 10-14 days to ensure expected progression. Electronically Signed   By: Charlett Nose M.D.   On: 03/27/2020 11:44   US OB LESS THAN 14 WEEKS WITH OB TRANSVAGINAL  Result Date: 03/18/2020 CLINICAL DATA:  29 year old pregnant female presenting with vaginal bleeding. LMP: 02/08/2020 corresponding to an estimated gestational age of [redacted] weeks, 4 days. EXAM: OBSTETRIC <14 WK [place] AND TRANSVAGINAL OB US TECHNIQUE: Both transabdominal and transvaginal ultrasound examinations were performed for complete evaluation  of the gestation as well as the maternal uterus, adnexal regions, and pelvic cul-de-sac. Transvaginal technique was performed to assess early pregnancy. COMPARISON:  None. FINDINGS: There is a sac-like structure within the right fundal endometrium. No yolk sac or fetal pole identified within this structure. There is apparent decidual reaction around this structure. This likely represents an early gestational sac. A blighted ovum or a pseudo gestation of an ectopic pregnancy not entirely excluded clinical correlation and follow-up recommended. If this cystic structure is a true gestational sac, the estimated gestational age based on mean sac diameter of 5 mm is 5 weeks, 1 day. Subchorionic hemorrhage:  None visualized. Maternal uterus/adnexae: The ovaries are unremarkable. There is a corpus luteum in the right ovary. IMPRESSION: Small sac-like structure within the endometrium, likely an early gestational sac. Clinical correlation and follow-up with ultrasound in 7-11 days, or earlier if clinically indicated, recommended. Electronically Signed   By: Elgie Collard M.D.   On: 03/18/2020 16:38   MAU Course  Procedures  MDM -rising hCG in setting of continued, intermittent vaginal  bleeding -RH positive -UA: amber/hazy/lg hgb/100PRO/trace leuks, sending urine for culture -hCG: 14,608 -[place]: single IUP, no FHT, CRL [redacted]w[redacted]d -CBC WNL 03/18/2020 -WetPrep/GC/CT WNL on 03/18/2020 -normal pelvic exam on 03/18/2020 -pt discharged to home in stable condition  Orders Placed This Encounter  Procedures  . Culture, OB Urine    Standing Status:   Standing    Number of Occurrences:   1  . US OB Transvaginal    Standing Status:   Standing    Number of Occurrences:   1    Order Specific Question:   Symptom/Reason for Exam    Answer:   Vaginal bleeding in pregnancy [[id number]]  . US OB LESS THAN 14 WEEKS WITH OB TRANSVAGINAL    Please schedule patient for follow-up US, Monday - Thursday between 8:00 am - 10:00 am and 12:00 pm - 3:00 pm. The patient will follow-up with [facility] immediately after [place] for results.    Standing Status:   Future    Standing Expiration Date:   05/27/2021    Order Specific Question:   Reason for Exam (SYMPTOM  OR DIAGNOSIS REQUIRED)    Answer:   viability    Order Specific Question:   Preferred Imaging Location?    Answer:   [facility]  . Urinalysis, Routine w reflex microscopic    Standing Status:   Standing    Number of Occurrences:   1  . hCG, quantitative, pregnancy    Standing Status:   Standing    Number of Occurrences:   1  . Discharge patient    Order Specific Question:   Discharge disposition    Answer:   01-Home or Self Care [1]    Order Specific Question:   Discharge patient date    Answer:   03/27/2020   No orders of the defined types were placed in this encounter.  Assessment and Plan   1. Intrauterine pregnancy   2. Vaginal bleeding in pregnancy   3. Blood type, Rh positive   4. Pregnancy with inconclusive fetal viability, single or unspecified fetus    Allergies as of 03/27/2020      Reactions   Azithromycin Hives      Medication List    TAKE these medications   Butalbital-APAP-Caffeine 50-325-40 MG capsule Take 1-2  capsules by mouth every 6 (six) hours as needed for headache.   lisdexamfetamine 50 MG capsule Commonly known as: VYVANSE Take 50  mg by mouth daily.   nitrofurantoin (macrocrystal-monohydrate) 100 MG capsule Commonly known as: MACROBID Take 1 capsule (100 mg total) by mouth 2 (two) times daily.   potassium chloride SA 20 MEQ tablet Commonly known as: KLOR-CON Take 2 tablets (40 mEq total) by mouth 2 (two) times daily for 5 days.   SUMAtriptan 50 MG tablet Commonly known as: IMITREX Take 50 mg by mouth every 2 (two) hours as needed for migraine. May repeat in 2 hours if headache persists or recurs.   venlafaxine 75 MG tablet Commonly known as: EFFEXOR Take 75 mg by mouth 3 (three) times daily with meals.      -will call with culture results, if positive -pt advised to call prescriber of Effexor and Sumatriptan to discuss use in pregnancy and/or ask for early visit with OB provider of choice -list of OB providers given -safe meds in pregnancy list given -viability [place] in 10-14 days -s/sx of miscarriage vs. [facility] given -return MAU precautions given -pt discharged to home in stable condition  Elmyra Ricks E Asanti Craigo 03/27/2020, 1:30 PM

## 2020-03-27 NOTE — Discharge Instructions (Signed)
Safe Medications in Pregnancy    Acne: Benzoyl Peroxide Salicylic Acid  Backache/Headache: Tylenol: 2 regular strength every 4 hours OR              2 Extra strength every 6 hours  Colds/Coughs/Allergies: Benadryl (alcohol free) 25 mg every 6 hours as needed Breath right strips Claritin Cepacol throat lozenges Chloraseptic throat spray Cold-Eeze- up to three times per day Cough drops, alcohol free Flonase (by prescription only) Guaifenesin Mucinex Robitussin DM (plain only, alcohol free) Saline nasal spray/drops Sudafed (pseudoephedrine) & Actifed ** use only after [redacted] weeks gestation and if you do not have high blood pressure Tylenol Vicks Vaporub Zinc lozenges Zyrtec   Constipation: Colace Ducolax suppositories Fleet enema Glycerin suppositories Metamucil Milk of magnesia Miralax Senokot Smooth move tea  Diarrhea: Kaopectate Imodium A-D  *NO pepto Bismol  Hemorrhoids: Anusol Anusol HC Preparation H Tucks  Indigestion: Tums Maalox Mylanta Zantac  Pepcid  Insomnia: Benadryl (alcohol free)  every 6 hours as needed Tylenol PM Unisom, no Gelcaps  Leg Cramps: Tums MagGel  Nausea/Vomiting:  Bonine Dramamine Emetrol Ginger extract Sea bands Meclizine  Nausea medication to take during pregnancy:  Unisom (doxylamine succinate 25 mg tablets) Take one tablet daily at bedtime. If symptoms are not adequately controlled, the dose can be increased to a maximum recommended dose of two tablets daily (1/2 tablet in the morning, 1/2 tablet mid-afternoon and one at bedtime). Vitamin B6  tablets. Take one tablet twice a day (up to 200 mg per day).  Skin Rashes: Aveeno products Benadryl cream or  every 6 hours as needed Calamine Lotion 1% cortisone cream  Yeast infection: Gyne-lotrimin 7 Monistat 7   **If taking multiple medications, please check labels to avoid duplicating the same active ingredients **take  medication as directed on the label ** Do not exceed 4000 mg of tylenol in 24 hours **Do not take medications that contain aspirin or ibuprofen      [facility]     Phone: [phone]  [facility] [place]  Phone: [phone]  [facility] [place]  Phone: [phone]  [facility] [place]                           Phone: [phone]  [facility]          Phone: [phone]  [facility]    Phone: [phone]   [facility])    Phone: [phone]  [facility]    Phone: [phone]  [facility]    Phone: [phone]  [facility]    Phone: [phone]  [facility]  Phone: [phone]  [facility]               Phone: [phone]  [facility] [place]   Phone: [phone]  [facility]    Phone: [phone]                  Vaginal Bleeding During Pregnancy, First Trimester  A small amount of bleeding from the vagina (spotting) is relatively common during early pregnancy. It usually stops on its own. Various things may cause bleeding or spotting during early pregnancy. Some bleeding may be related to the pregnancy, and some may not. In many cases, the bleeding is  normal and is not a problem. However, bleeding can also be a sign of something serious. Be sure to tell your health care provider about any vaginal bleeding right away. Some possible causes of vaginal bleeding during the first trimester include:  Infection or inflammation of the cervix.  Growths (polyps) on the cervix.  Miscarriage or threatened miscarriage.  Pregnancy tissue developing outside of the uterus (ectopic pregnancy).  A mass of  tissue developing in the uterus due to an egg being fertilized incorrectly (molar pregnancy). Follow these instructions at home: Activity  Follow instructions from your health care provider about limiting your activity. Ask what activities are safe for you.  If needed, make plans for someone to help with your regular activities.  Do not have sex or orgasms until your health care provider says that this is safe. General instructions  Take over-the-counter and prescription medicines only as told by your health care provider.  Pay attention to any changes in your symptoms.  Do not use tampons or douche.  Write down how many pads you use each day, how often you change pads, and how soaked (saturated) they are.  If you pass any tissue from your vagina, save the tissue so you can show it to your health care provider.  Keep all follow-up visits as told by your health care provider. This is important. Contact a health care provider if:  You have vaginal bleeding during any part of your pregnancy.  You have cramps or labor pains.  You have a fever. Get help right away if:  You have severe cramps in your back or abdomen.  You pass large clots or a large amount of tissue from your vagina.  Your bleeding increases.  You feel light-headed or weak, or you faint.  You have chills.  You are leaking fluid or have a gush of fluid from your vagina. Summary  A small amount of bleeding (spotting) from the vagina is relatively common during early pregnancy.  Various things may cause bleeding or spotting in early pregnancy.  Be sure to tell your health care provider about any vaginal bleeding right away. This information is not intended to replace advice given to you by your health care provider. Make sure you discuss any questions you have with your health care provider. Document Revised: 03/09/2019 Document Reviewed: 02/20/2017 Elsevier Patient Education  [place] Hematoma  A subchorionic hematoma is a gathering of blood between the outer wall of the embryo (chorion) and the inner wall of the womb (uterus). This condition can cause vaginal bleeding. If they cause little or no vaginal bleeding, early small hematomas usually shrink on their own and do not affect your baby or pregnancy. When bleeding starts later in pregnancy, or if the hematoma is larger or occurs in older pregnant women, the condition may be more serious. Larger hematomas may get bigger, which increases the chances of miscarriage. This condition also increases the risk of:  Premature separation of the placenta from the uterus.  Premature (preterm) labor.  Stillbirth. What are the causes? The exact cause of this condition is not known. It occurs when blood is trapped between the placenta and the uterine wall because the placenta has separated from the original site of implantation. What increases the risk? You are more likely to develop this condition if:  You were treated with fertility medicines.  You conceived through in vitro fertilization (IVF). What are the signs or symptoms? Symptoms of  this condition include:  Vaginal spotting or bleeding.  Contractions of the uterus. These cause abdominal pain. Sometimes you may have no symptoms and the bleeding may only be seen when ultrasound images are taken (transvaginal ultrasound). How is this diagnosed? This condition is diagnosed based on a physical exam. This includes a pelvic exam. You may also have other tests, including:  Blood tests.  Urine tests.  Ultrasound of the abdomen. How is this treated? Treatment for this condition can vary. Treatment may include:  Watchful waiting. You will be monitored closely for any changes in bleeding. During this stage: ? The hematoma may be reabsorbed by the body. ? The hematoma may separate the fluid-filled space containing the embryo (gestational sac) from  the wall of the womb (endometrium).  Medicines.  Activity restriction. This may be needed until the bleeding stops. Follow these instructions at home:  Stay on bed rest if told to do so by your health care provider.  Do not lift anything that is heavier than 10 lbs. (4.5 kg) or as told by your health care provider.  Do not use any products that contain nicotine or tobacco, such as cigarettes and e-cigarettes. If you need help quitting, ask your health care provider.  Track and write down the number of pads you use each day and how soaked (saturated) they are.  Do not use tampons.  Keep all follow-up visits as told by your health care provider. This is important. Your health care provider may ask you to have follow-up blood tests or ultrasound tests or both. Contact a health care provider if:  You have any vaginal bleeding.  You have a fever. Get help right away if:  You have severe cramps in your stomach, back, abdomen, or pelvis.  You pass large clots or tissue. Save any tissue for your health care provider to look at.  You have more vaginal bleeding, and you faint or become lightheaded or weak. Summary  A subchorionic hematoma is a gathering of blood between the outer wall of the placenta and the uterus.  This condition can cause vaginal bleeding.  Sometimes you may have no symptoms and the bleeding may only be seen when ultrasound images are taken.  Treatment may include watchful waiting, medicines, or activity restriction. This information is not intended to replace advice given to you by your health care provider. Make sure you discuss any questions you have with your health care provider. Document Revised: 10/31/2017 Document Reviewed: 01/14/2017 Elsevier Patient Education  2020 [employer].     First Trimester of Pregnancy The first trimester of pregnancy is from week 1 until the end of week 13 (months 1 through 3). A week after a sperm fertilizes an egg, the  egg will implant on the wall of the uterus. This embryo will begin to develop into a baby. Genes from you and your partner will form the baby. The female genes will determine whether the baby will be a boy or a girl. At 6-8 weeks, the eyes and face will be formed, and the heartbeat can be seen on ultrasound. At the end of 12 weeks, all the baby's organs will be formed. Now that you are pregnant, you will want to do everything you can to have a healthy baby. Two of the most important things are to get good prenatal care and to follow your health care provider's instructions. Prenatal care is all the medical care you receive before the baby's birth. This care will help prevent, find,  and treat any problems during the pregnancy and childbirth. Body changes during your first trimester Your body goes through many changes during pregnancy. The changes vary from woman to woman.  You may gain or lose a couple of pounds at first.  You may feel sick to your stomach (nauseous) and you may throw up (vomit). If the vomiting is uncontrollable, call your health care provider.  You may tire easily.  You may develop headaches that can be relieved by medicines. All medicines should be approved by your health care provider.  You may urinate more often. Painful urination may mean you have a bladder infection.  You may develop heartburn as a result of your pregnancy.  You may develop constipation because certain hormones are causing the muscles that push stool through your intestines to slow down.  You may develop hemorrhoids or swollen veins (varicose veins).  Your breasts may begin to grow larger and become tender. Your nipples may stick out more, and the tissue that surrounds them (areola) may become darker.  Your gums may bleed and may be sensitive to brushing and flossing.  Dark spots or blotches (chloasma, mask of pregnancy) may develop on your face. This will likely fade after the baby is born.  Your  menstrual periods will stop.  You may have a loss of appetite.  You may develop cravings for certain kinds of food.  You may have changes in your emotions from day to day, such as being excited to be pregnant or being concerned that something may go wrong with the pregnancy and baby.  You may have more vivid and strange dreams.  You may have changes in your hair. These can include thickening of your hair, rapid growth, and changes in texture. Some women also have hair loss during or after pregnancy, or hair that feels dry or thin. Your hair will most likely return to normal after your baby is born. What to expect at prenatal visits During a routine prenatal visit:  You will be weighed to make sure you and the baby are growing normally.  Your blood pressure will be taken.  Your abdomen will be measured to track your baby's growth.  The fetal heartbeat will be listened to between weeks 10 and 14 of your pregnancy.  Test results from any previous visits will be discussed. Your health care provider may ask you:  How you are feeling.  If you are feeling the baby move.  If you have had any abnormal symptoms, such as leaking fluid, bleeding, severe headaches, or abdominal cramping.  If you are using any tobacco products, including cigarettes, chewing tobacco, and electronic cigarettes.  If you have any questions. Other tests that may be performed during your first trimester include:  Blood tests to find your blood type and to check for the presence of any previous infections. The tests will also be used to check for low iron levels (anemia) and protein on red blood cells (Rh antibodies). Depending on your risk factors, or if you previously had diabetes during pregnancy, you may have tests to check for high blood sugar that affects pregnant women (gestational diabetes).  Urine tests to check for infections, diabetes, or protein in the urine.  An ultrasound to confirm the proper growth  and development of the baby.  Fetal screens for spinal cord problems (spina bifida) and Down syndrome.  HIV (human immunodeficiency virus) testing. Routine prenatal testing includes screening for HIV, unless you choose not to have this test.  You  may need other tests to make sure you and the baby are doing well. Follow these instructions at home: Medicines  Follow your health care provider's instructions regarding medicine use. Specific medicines may be either safe or unsafe to take during pregnancy.  Take a prenatal vitamin that contains at least 600 micrograms (mcg) of folic acid.  If you develop constipation, try taking a stool softener if your health care provider approves. Eating and drinking   Eat a balanced diet that includes fresh fruits and vegetables, whole grains, good sources of protein such as meat, eggs, or tofu, and low-fat dairy. Your health care provider will help you determine the amount of weight gain that is right for you.  Avoid raw meat and uncooked cheese. These carry germs that can cause birth defects in the baby.  Eating four or five small meals rather than three large meals a day may help relieve nausea and vomiting. If you start to feel nauseous, eating a few soda crackers can be helpful. Drinking liquids between meals, instead of during meals, also seems to help ease nausea and vomiting.  Limit foods that are high in fat and processed sugars, such as fried and sweet foods.  To prevent constipation: ? Eat foods that are high in fiber, such as fresh fruits and vegetables, whole grains, and beans. ? Drink enough fluid to keep your urine clear or pale yellow. Activity  Exercise only as directed by your health care provider. Most women can continue their usual exercise routine during pregnancy. Try to exercise for 30 minutes at least 5 days a week. Exercising will help you: ? Control your weight. ? Stay in shape. ? Be prepared for labor and  delivery.  Experiencing pain or cramping in the lower abdomen or lower back is a good sign that you should stop exercising. Check with your health care provider before continuing with normal exercises.  Try to avoid standing for long periods of time. Move your legs often if you must stand in one place for a long time.  Avoid heavy lifting.  Wear low-heeled shoes and practice good posture.  You may continue to have sex unless your health care provider tells you not to. Relieving pain and discomfort  Wear a good support bra to relieve breast tenderness.  Take warm sitz baths to soothe any pain or discomfort caused by hemorrhoids. Use hemorrhoid cream if your health care provider approves.  Rest with your legs elevated if you have leg cramps or low back pain.  If you develop varicose veins in your legs, wear support hose. Elevate your feet for 15 minutes, 3-4 times a day. Limit salt in your diet. Prenatal care  Schedule your prenatal visits by the twelfth week of pregnancy. They are usually scheduled monthly at first, then more often in the last 2 months before delivery.  Write down your questions. Take them to your prenatal visits.  Keep all your prenatal visits as told by your health care provider. This is important. Safety  Wear your seat belt at all times when driving.  Make a list of emergency phone numbers, including numbers for family, friends, the hospital, and police and fire departments. General instructions  Ask your health care provider for a referral to a local prenatal education class. Begin classes no later than the beginning of month 6 of your pregnancy.  Ask for help if you have counseling or nutritional needs during pregnancy. Your health care provider can offer advice or refer you to specialists  for help with various needs.  Do not use hot tubs, steam rooms, or saunas.  Do not douche or use tampons or scented sanitary pads.  Do not cross your legs for long  periods of time.  Avoid cat litter boxes and soil used by cats. These carry germs that can cause birth defects in the baby and possibly loss of the fetus by miscarriage or stillbirth.  Avoid all smoking, herbs, alcohol, and medicines not prescribed by your health care provider. Chemicals in these products affect the formation and growth of the baby.  Do not use any products that contain nicotine or tobacco, such as cigarettes and e-cigarettes. If you need help quitting, ask your health care provider. You may receive counseling support and other resources to help you quit.  Schedule a dentist appointment. At home, brush your teeth with a soft toothbrush and be gentle when you floss. Contact a health care provider if:  You have dizziness.  You have mild pelvic cramps, pelvic pressure, or nagging pain in the abdominal area.  You have persistent nausea, vomiting, or diarrhea.  You have a bad smelling vaginal discharge.  You have pain when you urinate.  You notice increased swelling in your face, hands, legs, or ankles.  You are exposed to fifth disease or chickenpox.  You are exposed to [place] measles (rubella) and have never had it. Get help right away if:  You have a fever.  You are leaking fluid from your vagina.  You have spotting or bleeding from your vagina.  You have severe abdominal cramping or pain.  You have rapid weight gain or loss.  You vomit blood or material that looks like coffee grounds.  You develop a severe headache.  You have shortness of breath.  You have any kind of trauma, such as from a fall or a car accident. Summary  The first trimester of pregnancy is from week 1 until the end of week 13 (months 1 through 3).  Your body goes through many changes during pregnancy. The changes vary from woman to woman.  You will have routine prenatal visits. During those visits, your health care provider will examine you, discuss any test results you may have,  and talk with you about how you are feeling. This information is not intended to replace advice given to you by your health care provider. Make sure you discuss any questions you have with your health care provider. Document Revised: 10/31/2017 Document Reviewed: 10/30/2016 Elsevier Patient Education  2020 [employer].        Miscarriage A miscarriage is the loss of an unborn baby (fetus) before the 20th week of pregnancy. Most miscarriages happen during the first 3 months of pregnancy. Sometimes, a miscarriage can happen before a woman knows that she is pregnant. Having a miscarriage can be an emotional experience. If you have had a miscarriage, talk with your health care provider about any questions you may have about miscarrying, the grieving process, and your plans for future pregnancy. What are the causes? A miscarriage may be caused by:  Problems with the genes or chromosomes of the fetus. These problems make it impossible for the baby to develop normally. They are often the result of random errors that occur early in the development of the baby, and are not passed from parent to child (not inherited).  Infection of the cervix or uterus.  Conditions that affect hormone balance in the body.  Problems with the cervix, such as the cervix opening and  thinning before pregnancy is at term (cervical insufficiency).  Problems with the uterus. These may include: ? A uterus with an abnormal shape. ? Fibroids in the uterus. ? Congenital abnormalities. These are problems that were present at birth.  Certain medical conditions.  Smoking, drinking alcohol, or using drugs.  Injury (trauma). In many cases, the cause of a miscarriage is not known. What are the signs or symptoms? Symptoms of this condition include:  Vaginal bleeding or spotting, with or without cramps or pain.  Pain or cramping in the abdomen or lower back.  Passing fluid, tissue, or blood clots from the vagina. How  is this diagnosed? This condition may be diagnosed based on:  A physical exam.  Ultrasound.  Blood tests.  Urine tests. How is this treated? Treatment for a miscarriage is sometimes not necessary if you naturally pass all the tissue that was in your uterus. If necessary, this condition may be treated with:  Dilation and curettage (D&C). This is a procedure in which the cervix is stretched open and the lining of the uterus (endometrium) is scraped. This is done only if tissue from the fetus or placenta remains in the body (incomplete miscarriage).  Medicines, such as: ? Antibiotic medicine, to treat infection. ? Medicine to help the body pass any remaining tissue. ? Medicine to reduce (contract) the size of the uterus. These medicines may be given if you have a lot of bleeding. If you have Rh negative blood and your baby was Rh positive, you will need a shot of a medicine called Rh immunoglobulinto protect your future babies from Rh blood problems. "Rh-negative" and "Rh-positive" refer to whether or not the blood has a specific protein found on the surface of red blood cells (Rh factor). Follow these instructions at home: Medicines   Take over-the-counter and prescription medicines only as told by your health care provider.  If you were prescribed antibiotic medicine, take it as told by your health care provider. Do not stop taking the antibiotic even if you start to feel better.  Do not take NSAIDs, such as aspirin and ibuprofen, unless they are approved by your health care provider. These medicines can cause bleeding. Activity  Rest as directed. Ask your health care provider what activities are safe for you.  Have someone help with home and family responsibilities during this time. General instructions  Keep track of the number of sanitary pads you use each day and how soaked (saturated) they are. Write down this information.  Monitor the amount of tissue or blood clots that  you pass from your vagina. Save any large amounts of tissue for your health care provider to examine.  Do not use tampons, douche, or have sex until your health care provider approves.  To help you and your partner with the process of grieving, talk with your health care provider or seek counseling.  When you are ready, meet with your health care provider to discuss any important steps you should take for your health. Also, discuss steps you should take to have a healthy pregnancy in the future.  Keep all follow-up visits as told by your health care provider. This is important. Where to find more information  The American Congress of Obstetricians and Gynecologists: www.acog.org  [place] Department of Health and Cytogeneticist of Women's Health: http://hoffman.com/ Contact a health care provider if:  You have a fever or chills.  You have a foul smelling vaginal discharge.  You have more bleeding instead of less.  Get help right away if:  You have severe cramps or pain in your back or abdomen.  You pass blood clots or tissue from your vagina that is walnut-sized or larger.  You soak more than 1 regular sanitary pad in an hour.  You become light-headed or weak.  You pass out.  You have feelings of sadness that take over your thoughts, or you have thoughts of hurting yourself. Summary  Most miscarriages happen in the first 3 months of pregnancy. Sometimes miscarriage happens before a woman even knows that she is pregnant.  Follow your health care provider's instruction for home care. Keep all follow-up appointments.  To help you and your partner with the process of grieving, talk with your health care provider or seek counseling. This information is not intended to replace advice given to you by your health care provider. Make sure you discuss any questions you have with your health care provider. Document Revised: 03/12/2019 Document Reviewed: 12/24/2016 Elsevier Patient  Education  2020 [employer].        Threatened Miscarriage  A threatened miscarriage occurs when a woman has vaginal bleeding during the first 20 weeks of pregnancy but the pregnancy has not ended. If you have vaginal bleeding during this time, your health care provider will do tests to make sure you are still pregnant. If the tests show that you are still pregnant and that the developing baby (fetus) inside your uterus is still growing, your condition is considered a threatened miscarriage. A threatened miscarriage does not mean your pregnancy will end, but it does increase the risk of losing your pregnancy (complete miscarriage). What are the causes? The cause of this condition is usually not known. For women who go on to have a complete miscarriage, the most common cause is an abnormal number of chromosomes in the developing baby. Chromosomes are the structures inside cells that hold all of a person's genetic material. What increases the risk? The following lifestyle factors may increase your risk of a miscarriage in early pregnancy:  Smoking.  Drinking excessive amounts of alcohol or caffeine.  Recreational drug use. The following preexisting health conditions may increase your risk of a miscarriage in early pregnancy:  Polycystic ovary syndrome.  Uterine fibroids.  Infections.  Diabetes mellitus. What are the signs or symptoms? Symptoms of this condition include:  Vaginal bleeding.  Mild abdominal pain or cramps. How is this diagnosed? If you have bleeding with or without abdominal pain before 20 weeks of pregnancy, your health care provider will do tests to check whether you are still pregnant. These will include:  Ultrasound. This test uses sound waves to create images of the inside of your uterus. This allows your health care provider to look at your developing baby and other structures, such as your placenta.  Pelvic exam. This is an internal exam of your vagina  and cervix.  Measurement of your baby's heart rate.  Laboratory tests such as blood tests, urine tests, or swabs for infection You may be diagnosed with a threatened miscarriage if:  Ultrasound testing shows that you are still pregnant.  Your baby's heart rate is strong.  A pelvic exam shows that the opening between your uterus and your vagina (cervix) is closed.  Blood tests confirm that you are still pregnant. How is this treated? No treatments have been shown to prevent a threatened miscarriage from going on to a complete miscarriage. However, the right home care is important. Follow these instructions at home:  Get plenty  of rest.  Do not have sex or use tampons if you have vaginal bleeding.  Do not douche.  Do not smoke or use recreational drugs.  Do not drink alcohol.  Avoid caffeine.  Keep all follow-up prenatal visits as told by your health care provider. This is important. Contact a health care provider if:  You have light vaginal bleeding or spotting while pregnant.  You have abdominal pain or cramping.  You have a fever. Get help right away if:  You have heavy vaginal bleeding.  You have blood clots coming from your vagina.  You pass tissue from your vagina.  You leak fluid, or you have a gush of fluid from your vagina.  You have severe low back pain or abdominal cramps.  You have fever, chills, and severe abdominal pain. Summary  A threatened miscarriage occurs when a woman has vaginal bleeding during the first 20 weeks of pregnancy but the pregnancy has not ended.  The cause of a threatened miscarriage is usually not known.  Symptoms of this condition may include vaginal bleeding and mild abdominal pain or cramps.  No treatments have been shown to prevent a threatened miscarriage from going on to a complete miscarriage.  Keep all follow-up prenatal visits as told by your health care provider. This is important. This information is not  intended to replace advice given to you by your health care provider. Make sure you discuss any questions you have with your health care provider. Document Revised: 12/25/2017 Document Reviewed: 02/14/2017 Elsevier Patient Education  2020 [employer].

## 2020-03-28 ENCOUNTER — Ambulatory Visit: Payer: Medicaid Other

## 2020-03-28 ENCOUNTER — Ambulatory Visit (HOSPITAL_COMMUNITY): Admission: RE | Admit: 2020-03-28 | Payer: Medicaid Other | Source: Ambulatory Visit

## 2020-03-28 ENCOUNTER — Encounter: Payer: Self-pay | Admitting: *Deleted

## 2020-03-28 LAB — CULTURE, OB URINE

## 2020-04-06 ENCOUNTER — Other Ambulatory Visit (HOSPITAL_COMMUNITY): Payer: Self-pay | Admitting: Women's Health

## 2020-04-06 ENCOUNTER — Ambulatory Visit (HOSPITAL_COMMUNITY)
Admission: RE | Admit: 2020-04-06 | Discharge: 2020-04-06 | Disposition: A | Discharge status: Home / Self Care | Payer: Medicaid Other | Source: Ambulatory Visit | Attending: Women's Health | Admitting: Women's Health

## 2020-04-06 ENCOUNTER — Other Ambulatory Visit: Payer: Self-pay

## 2020-04-06 ENCOUNTER — Ambulatory Visit (INDEPENDENT_AMBULATORY_CARE_PROVIDER_SITE_OTHER): Payer: Medicaid Other | Admitting: Obstetrics and Gynecology

## 2020-04-06 DIAGNOSIS — O3680X0 Pregnancy with inconclusive fetal viability, not applicable or unspecified: Secondary | ICD-10-CM | POA: Diagnosis present

## 2020-04-06 DIAGNOSIS — Z3490 Encounter for supervision of normal pregnancy, unspecified, unspecified trimester: Secondary | ICD-10-CM | POA: Diagnosis present

## 2020-04-06 DIAGNOSIS — O034 Incomplete spontaneous abortion without complication: Secondary | ICD-10-CM | POA: Diagnosis not present

## 2020-04-06 MED ORDER — MISOPROSTOL 200 MCG PO TABS: ORAL_TABLET | ORAL | 1 refills | Status: AC

## 2020-04-06 MED ORDER — IBUPROFEN 600 MG PO TABS: 600.0000 mg | ORAL_TABLET | Freq: Four times a day (QID) | ORAL | 0 refills | Status: AC | PRN

## 2020-04-06 MED ORDER — PROMETHAZINE HCL 25 MG PO TABS: 25.0000 mg | ORAL_TABLET | Freq: Four times a day (QID) | ORAL | 1 refills | Status: AC | PRN

## 2020-04-06 MED ORDER — OXYCODONE-ACETAMINOPHEN 5-325 MG PO TABS: 1.0000 | ORAL_TABLET | ORAL | 0 refills | Status: AC | PRN

## 2020-04-06 NOTE — Progress Notes (Signed)
Obstetrics and Gynecology Visit Return Patient Evaluation  Appointment Date: 04/06/2020  Primary Care Provider: Alanson Puls, [facility]  OBGYN Clinic: [facility]  Chief Complaint: follow up u/s  History of Present Illness:  Sonya Wilson is a 29 y.o. has been followed by our clinic after ED visit for VB and dx with early pregnancy. Today on viability u/s shows CRL smaller (see below). Pt having scant VB (panty liner only) and no fevers, chills, pain. She is O pos   Review of Systems:  as noted in the History of Present Illness.  Patient Active Problem List   Diagnosis Date Noted  . Incomplete abortion 04/06/2020   Medications:  Cherly Anderson had no medications administered during this visit. Current Outpatient Medications  Medication Sig Dispense Refill  . Butalbital-APAP-Caffeine 50-325-40 MG capsule Take 1-2 capsules by mouth every 6 (six) hours as needed for headache. 30 capsule 3  . ibuprofen (ADVIL) 600 MG tablet Take 1 tablet (600 mg total) by mouth every 6 (six) hours as needed. 30 tablet 0  . lisdexamfetamine (VYVANSE) 50 MG capsule Take 50 mg by mouth daily.    . misoprostol (CYTOTEC) 200 MCG tablet insert four tablets vaginally 4 tablet 1  . nitrofurantoin, macrocrystal-monohydrate, (MACROBID) 100 MG capsule Take 1 capsule (100 mg total) by mouth 2 (two) times daily. 10 capsule 0  . oxyCODONE-acetaminophen (PERCOCET/ROXICET) 5-325 MG tablet Take 1-2 tablets by mouth every 4 (four) hours as needed for severe pain. 12 tablet 0  . potassium chloride SA (KLOR-CON) 20 MEQ tablet Take 2 tablets (40 mEq total) by mouth 2 (two) times daily for 5 days. 20 tablet 0  . promethazine (PHENERGAN) 25 MG tablet Take 1 tablet (25 mg total) by mouth every 6 (six) hours as needed for nausea or vomiting. 30 tablet 1  . SUMAtriptan (IMITREX) 50 MG tablet Take 50 mg by mouth every 2 (two) hours as needed for migraine. May repeat in 2 hours if headache persists  or recurs.    . venlafaxine (EFFEXOR) 75 MG tablet Take 75 mg by mouth 3 (three) times daily with meals.     No current facility-administered medications for this visit.    Allergies: is allergic to azithromycin.  Physical Exam:  LMP 02/08/2020  There is no height or weight on file to calculate BMI. General appearance: Well nourished, well developed female in no acute distress.  Neuro/Psych:  Normal mood and affect.     Labs:  CBC Latest Ref Rng & Units 03/18/2020 11/10/2019  WBC 4.0 - 10.5 K/uL 9.4 8.4  Hemoglobin 12.0 - 15.0 g/dL 11.6(L) 12.1  Hematocrit 36.0 - 46.0 % 35.1(L) 34.9(L)  Platelets 150 - 400 K/uL 291 269    O POS  Radiology: CLINICAL DATA:  Assess viability.  Early pregnancy.  EXAM: TRANSVAGINAL OB ULTRASOUND  TECHNIQUE: Transvaginal ultrasound was performed for complete evaluation of the gestation as well as the maternal uterus, adnexal regions, and pelvic cul-de-sac.  COMPARISON:  None.  FINDINGS: Intrauterine gestational sac: Single, with irregular margins.  Yolk sac:  Visualized.  Embryo:  Visualized.  Cardiac Activity: No  Heart Rate: No bpm  CRL:   2.7 mm   5 w 6 d                  [place] EDC: 12/01/2020  Subchorionic hemorrhage:  None visualized.  Maternal uterus/adnexae:  Subchorionic hemorrhage: None  Right ovary: Normal  Left ovary: Normal  Other :None.  Free fluid:  Trace free  fluid noted.  IMPRESSION: 1. Single intrauterine gestational sac containing a yolk sac and embryo. No cardiac activity visualized. Findings are suspicious but not yet definitive for failed pregnancy. Recommend follow-up US in 10-14 days for definitive diagnosis. This recommendation follows SRU consensus guidelines: Diagnostic Criteria for Nonviable Pregnancy Early in the First Trimester. Malva Limes Med 2013; 767:2094-70. 2. These results will be called to the ordering clinician or representative by the Radiologist Assistant, and  communication documented in the PACS or [employer].   Electronically Signed   By: Signa Kell M.D.   On: 04/06/2020 09:16  CLINICAL DATA:  Vaginal bleeding for 1 week  EXAM: TRANSVAGINAL OB ULTRASOUND  TECHNIQUE: Transvaginal ultrasound was performed for complete evaluation of the gestation as well as the maternal uterus, adnexal regions, and pelvic cul-de-sac.  COMPARISON:  03/18/2020  FINDINGS: Intrauterine gestational sac: Single  Yolk sac:  Not visualized  Embryo:  Possible early embryo.  Cardiac Activity: Not visualized  Heart Rate:  bpm  MSD:   mm    w     d  CRL:   4.2 mm   6 w 1 d                  [place] EDC: 11/19/2020  Subchorionic hemorrhage:  None visualized.  Maternal uterus/adnexae: No adnexal mass or free fluid.  IMPRESSION: Intrauterine gestation with probable early fetal pole, 6 weeks 1 day by crown-rump length. No fetal heart tones detected currently, likely related to early gestational age. This could be followed with repeat ultrasound in 10-14 days to ensure expected progression.   Electronically Signed   By: Charlett Nose M.D.   On: 03/27/2020 11:44   Assessment: pt stable  Plan:  1. Incomplete abortion Images reviewed. Sac is also irregular, too. Options d/w her and I told her I recommend medical management and she is amenable to this. Instructions and Rx for cytotec, percocet, phenergan given  RTC: 2wks for follow up  Cornelia Copa MD Attending [facility])

## 2020-04-06 NOTE — Patient Instructions (Signed)

## 2020-04-10 ENCOUNTER — Ambulatory Visit: Payer: Medicaid Other

## 2020-04-24 ENCOUNTER — Ambulatory Visit: Payer: Medicaid Other | Admitting: Family Medicine

## 2020-04-27 ENCOUNTER — Telehealth: Payer: Self-pay

## 2020-04-27 NOTE — Telephone Encounter (Signed)
-----   Message from [place] Bing, MD sent at 04/25/2020 12:21 PM EDT ----- Regarding: RE: reschedule? Up to her. She at least needs a non stat beta but she was going to come and see a Lacara Dunsworth because she was also interested in birth control. Thanks ----- Message ----- From: Marjo Bicker, RN Sent: 04/24/2020   5:18 PM EDT To: [place] Bing, MD Subject: reschedule?                                    Do you want her rescheduled with a Devante Capano? Or just a lab visit for beta HCG?  Fleet Contras

## 2020-04-28 NOTE — Telephone Encounter (Signed)
Called pt; VM left stating I am calling to follow up about missed appt. Requested a call back. Call back number given. MyChart message sent.

## 2021-06-04 IMAGING — US US OB < 14 WEEKS - US OB TV
1 series · 15 of 28 positions shown · non-contrast
Comparison: None.

CLINICAL DATA: 29-year-old pregnant female presenting with vaginal
bleeding. LMP: 02/08/2020 corresponding to an estimated gestational
age of 5 weeks, 4 days.

EXAM:
OBSTETRIC <14 WK US AND TRANSVAGINAL OB US
TECHNIQUE: Both transabdominal and transvaginal ultrasound examinations were
performed for complete evaluation of the gestation as well as the
maternal uterus, adnexal regions, and pelvic cul-de-sac.
Transvaginal technique was performed to assess early pregnancy.

[Series 1: us ob < 14 weeks - us ob tv · 15 of 45 slices shown]
[im 1/45]
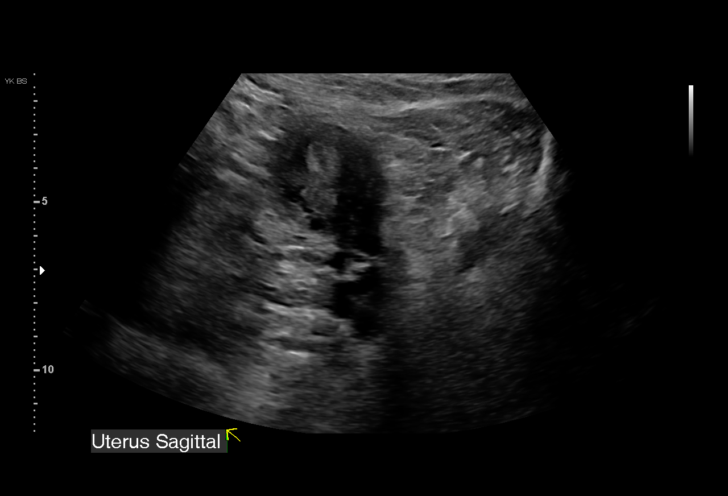
[im 4/45]
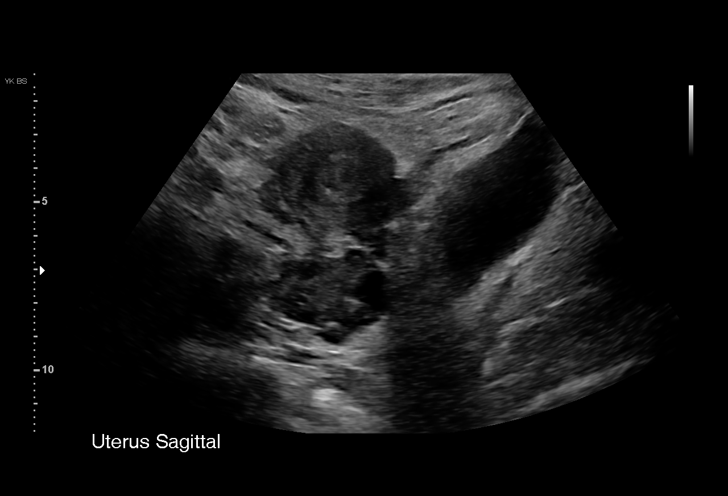
[im 7/45]
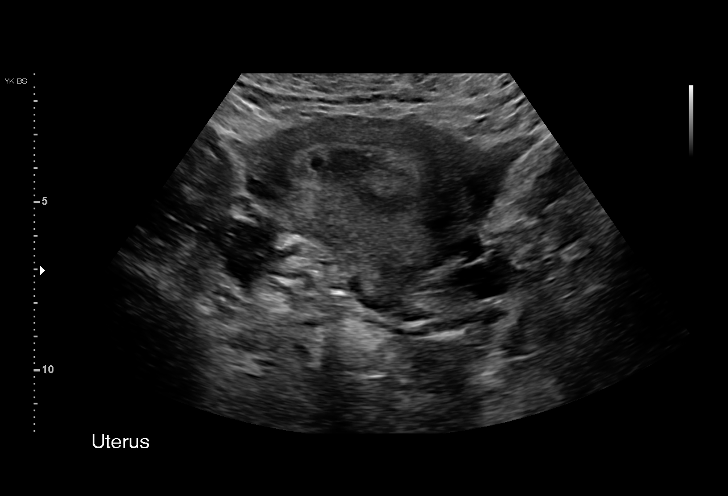
[im 10/45]
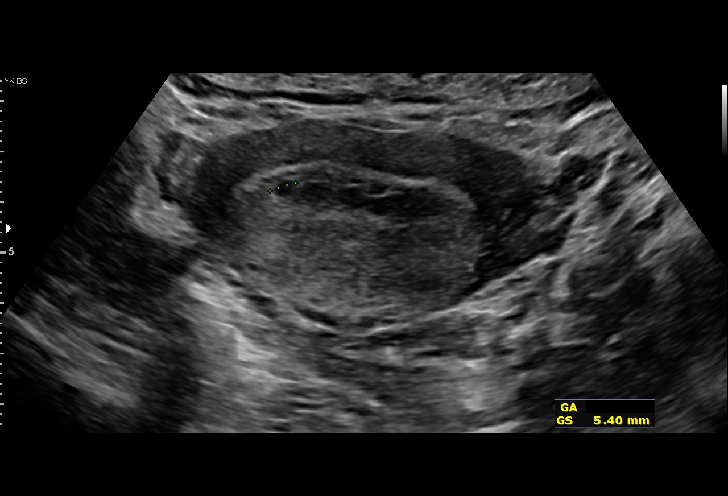
[im 14/45]
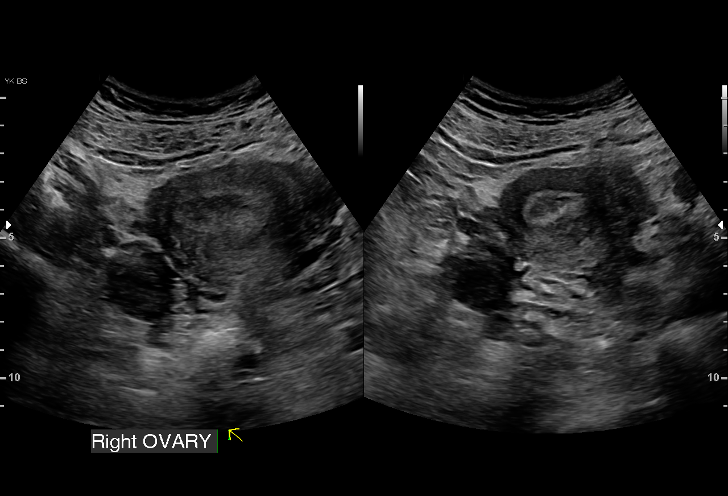
[im 17/45]
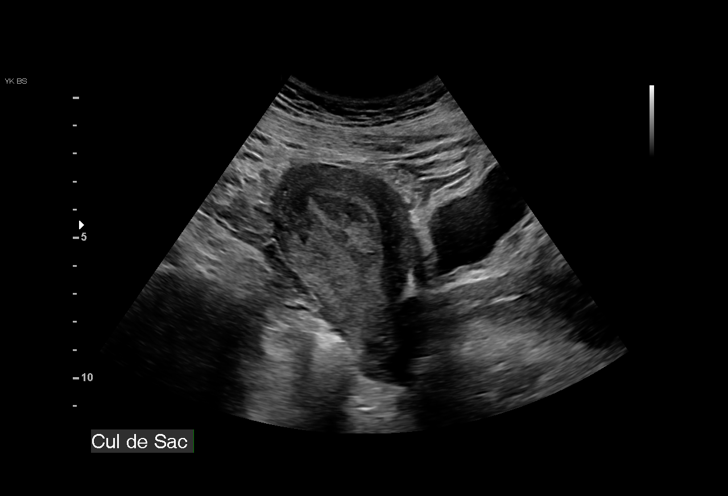
[im 20/45]
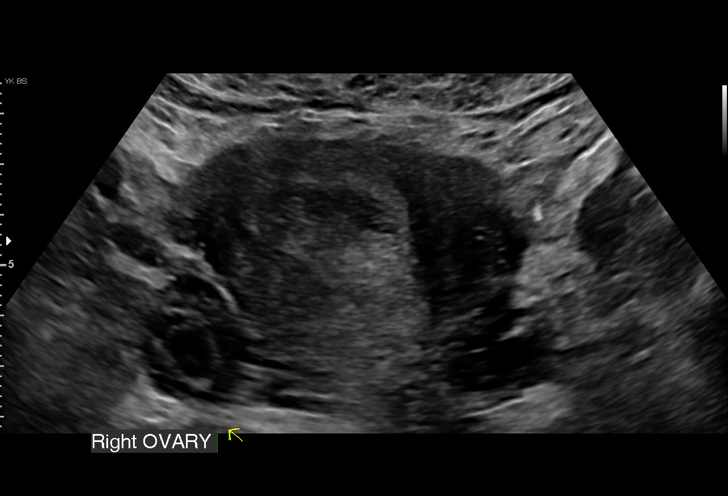
[im 23/45]
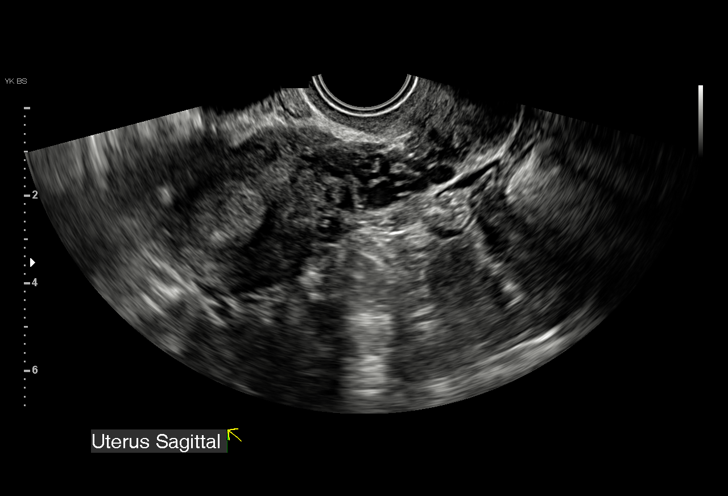
[im 25/45]
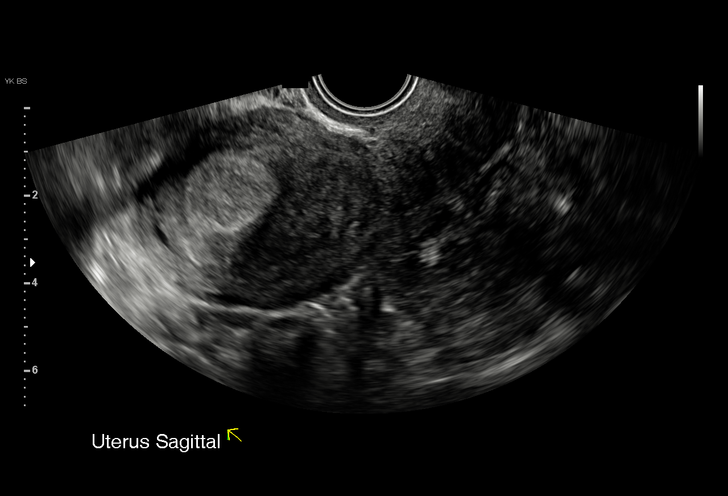
[im 28/45]
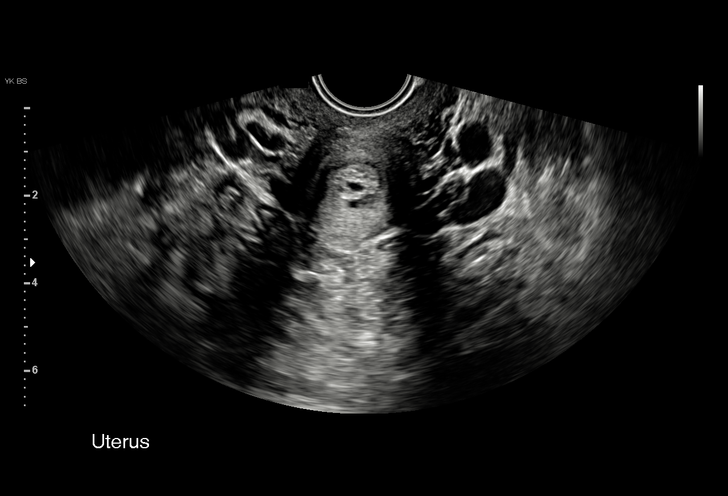
[im 31/45]
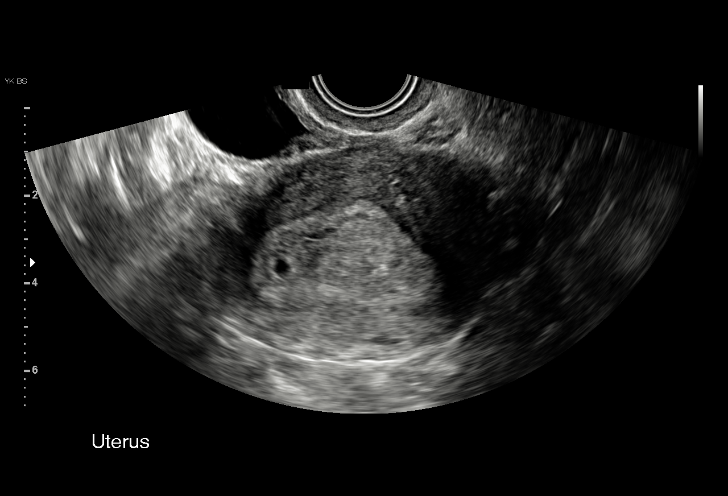
[im 35/45]
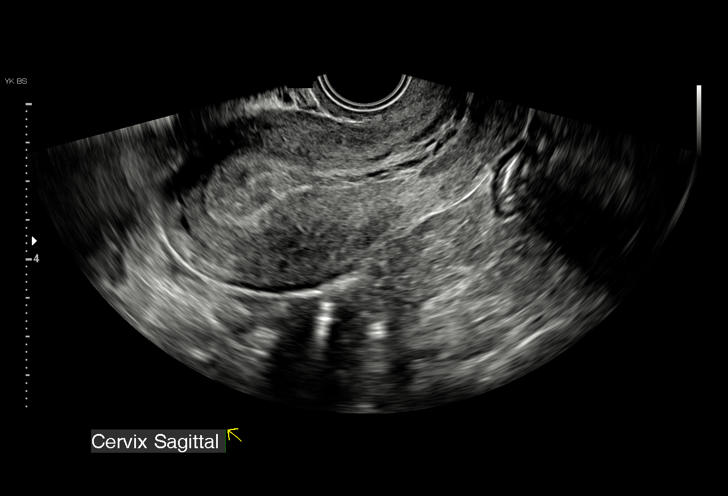
[im 38/45]
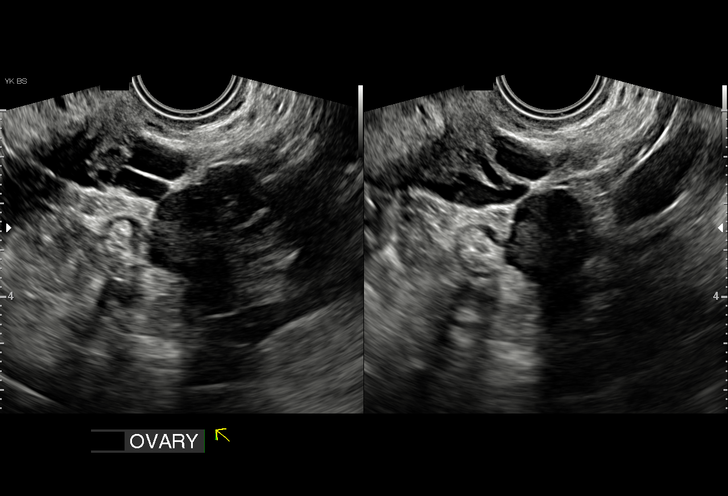
[im 41/45]
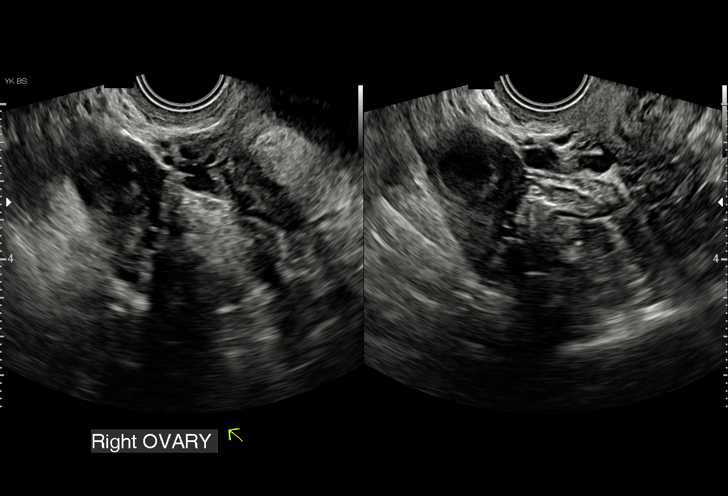
[im 45/45]
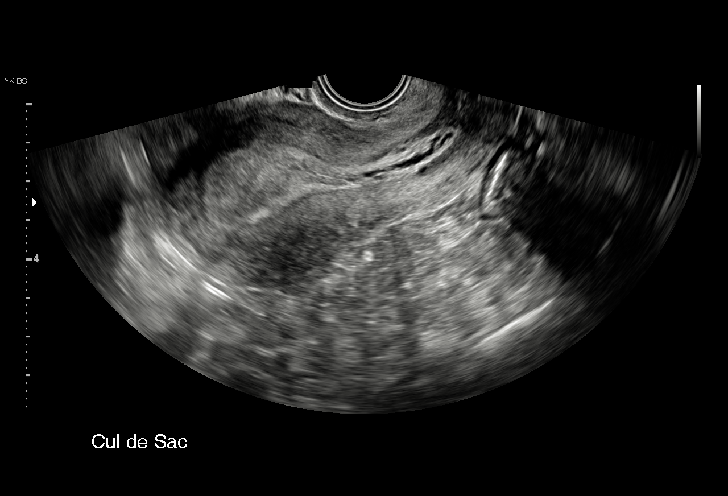

[15 of 28 positions shown; findings below may reference images not displayed]

FINDINGS: There is a sac-like structure within the right fundal endometrium.
No yolk sac or fetal pole identified within this structure. There is
apparent decidual reaction around this structure. This likely
represents an early gestational sac. A blighted ovum or a pseudo
gestation of an ectopic pregnancy not entirely excluded clinical
correlation and follow-up recommended.

If this cystic structure is a true gestational sac, the estimated
gestational age based on mean sac diameter of 5 mm is 5 weeks, 1
day.

Subchorionic hemorrhage:  None visualized.

Maternal uterus/adnexae: The ovaries are unremarkable. There is a
corpus luteum in the right ovary.
IMPRESSION: Small sac-like structure within the endometrium, likely an early
gestational sac. Clinical correlation and follow-up with ultrasound
in 7-11 days, or earlier if clinically indicated, recommended.

## 2021-06-13 IMAGING — US US OB TRANSVAGINAL
1 series · 16 of 28 positions shown · non-contrast
Comparison: 03/18/2020

CLINICAL DATA: Vaginal bleeding for 1 week

EXAM:
TRANSVAGINAL OB ULTRASOUND
TECHNIQUE: Transvaginal ultrasound was performed for complete evaluation of the
gestation as well as the maternal uterus, adnexal regions, and
pelvic cul-de-sac.

[Series 1: us ob transvaginal · 29 acquisitions, 16 frames shown]
[im 1/29]
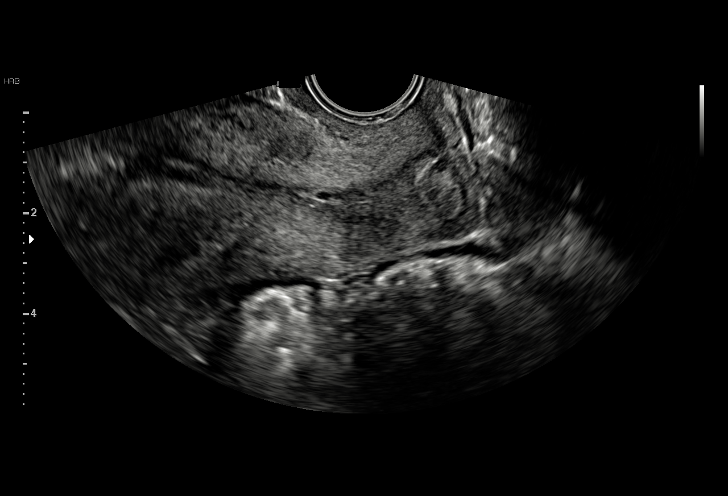
[im 3/29]
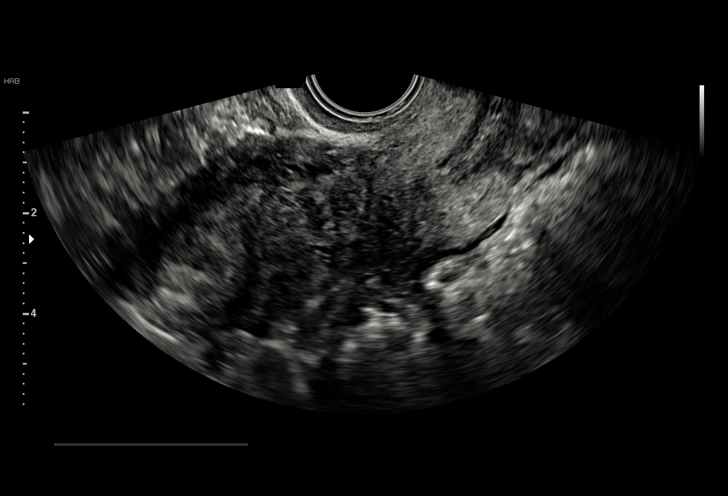
[im 5/29]
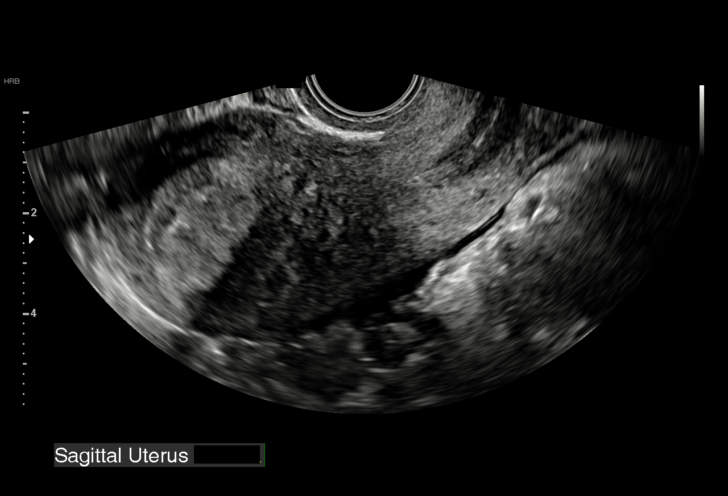
[im 7/29]
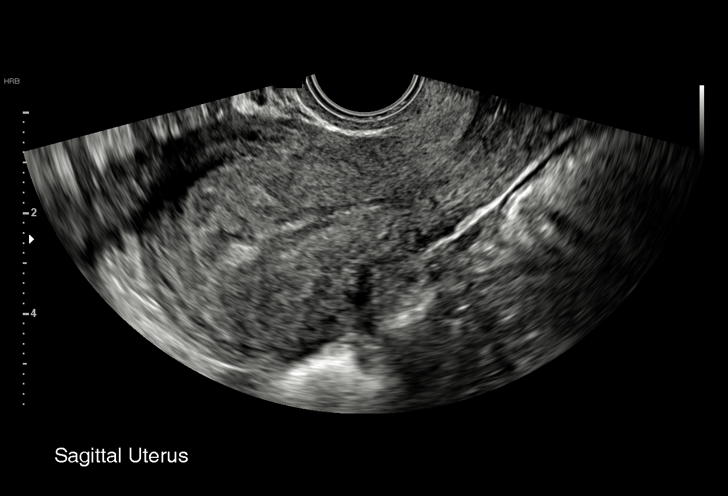
[im 8/29]
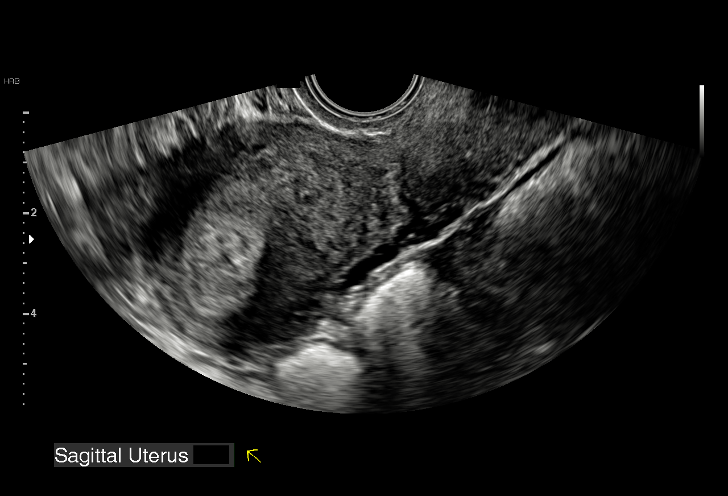
[im 10/29]
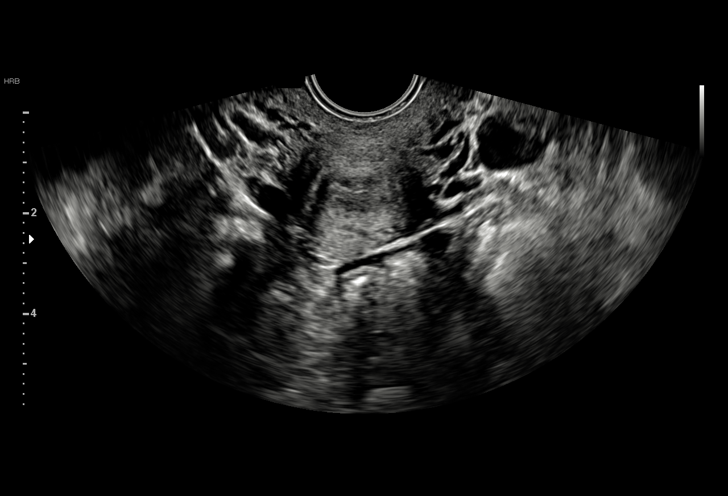
[im 12/29]
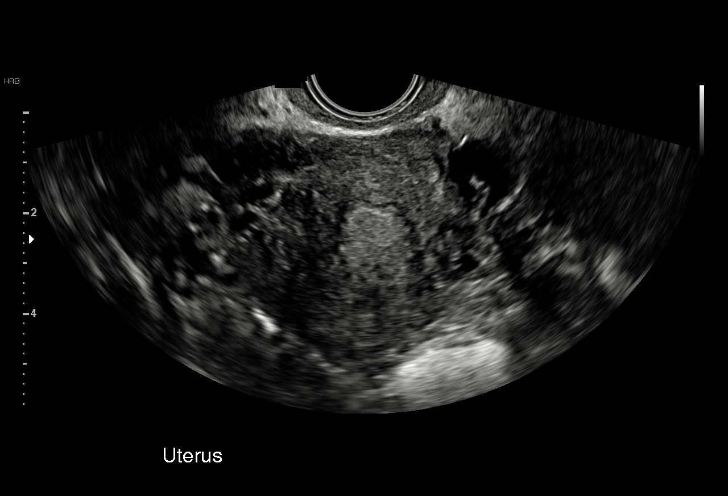
[im 14/29]
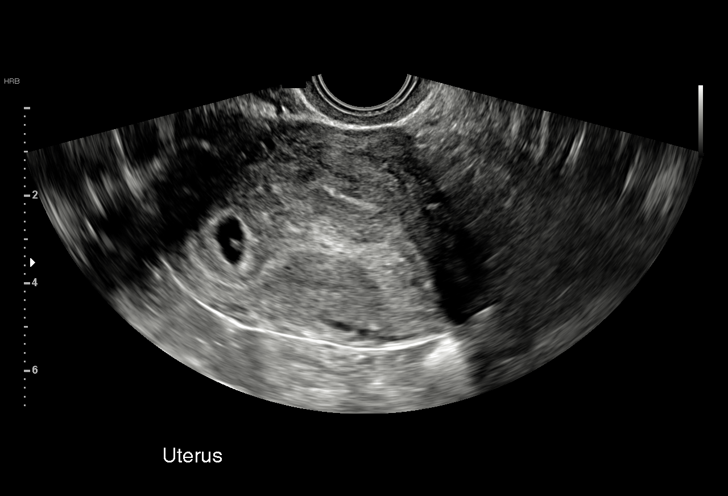
[im 15/29]
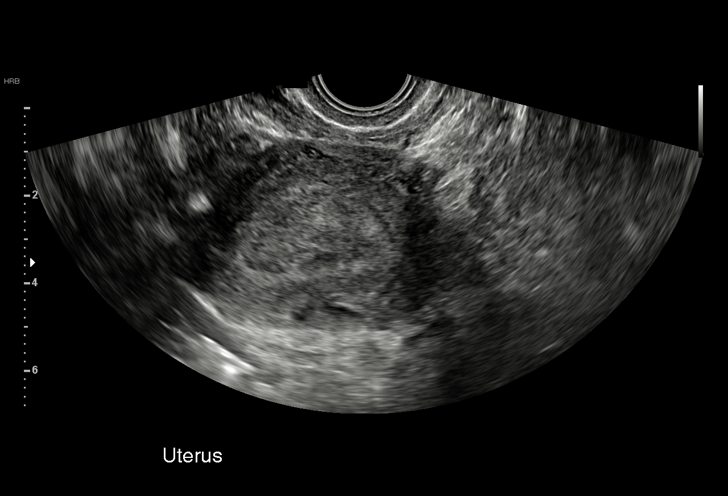
[im 17/29]
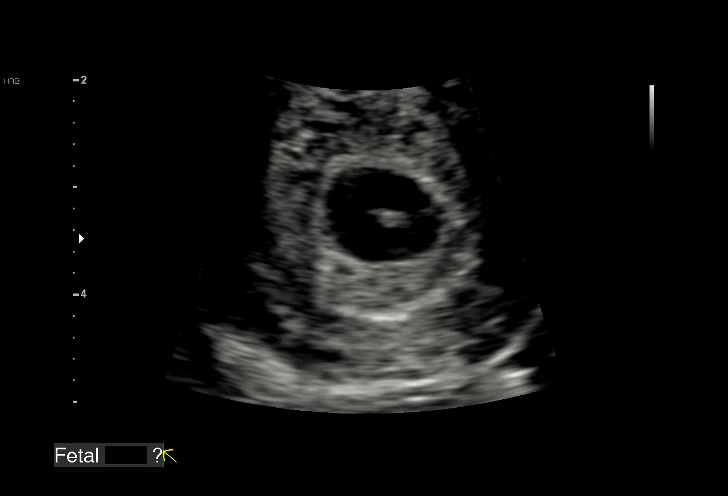
[im 19/29]
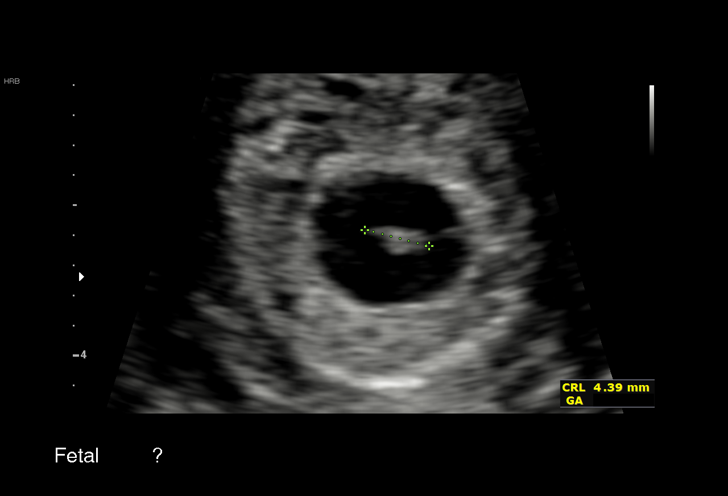
[im 21/29]
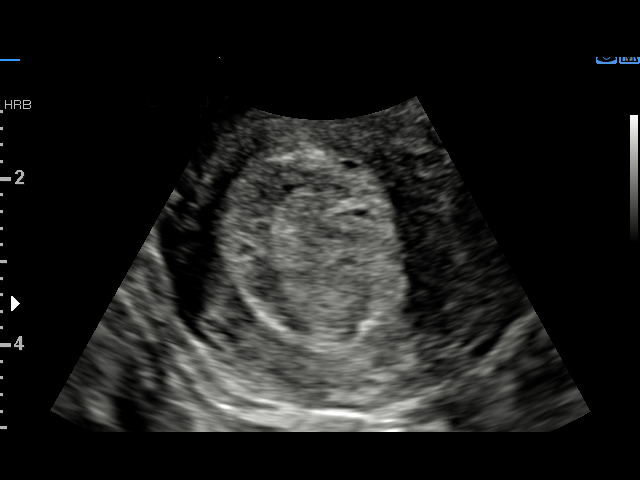
[im 22/29]
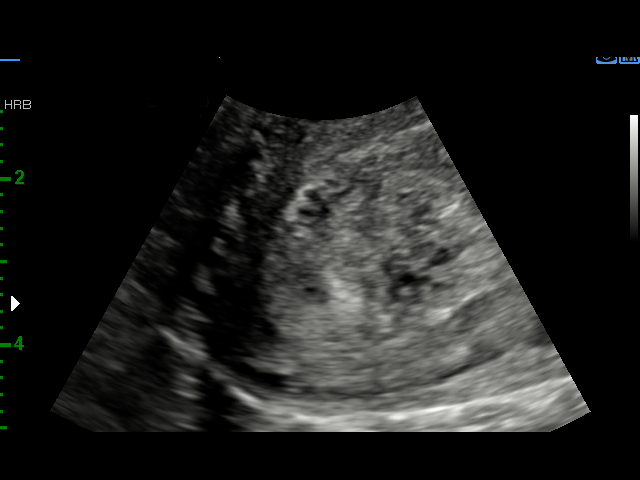
[im 24/29]
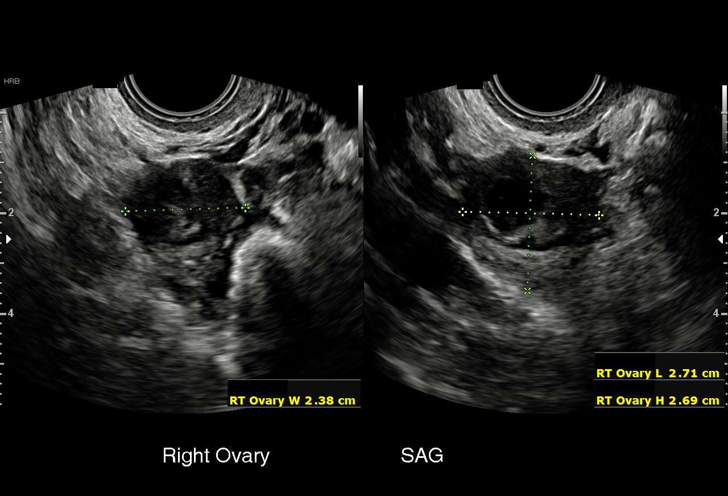
[im 26/29]
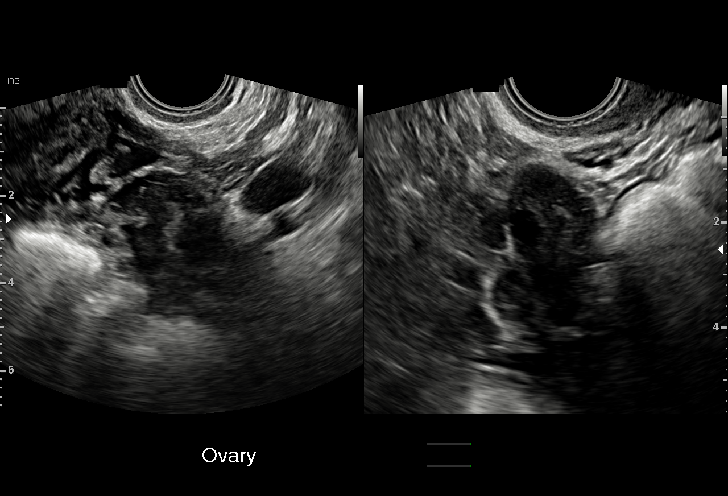
[im 29/29]
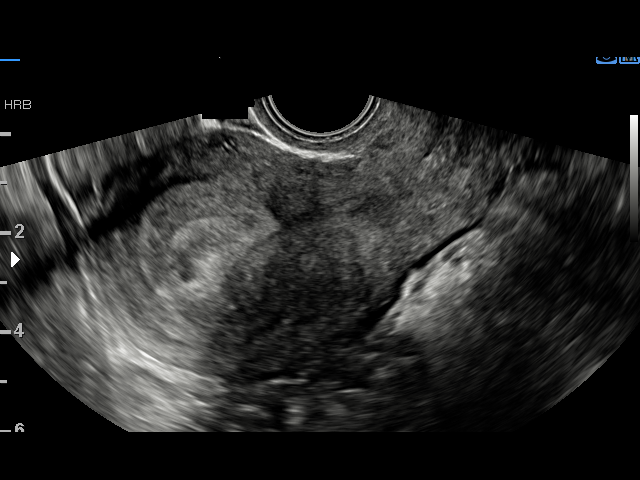

[16 of 28 positions shown; findings below may reference images not displayed]

FINDINGS: Intrauterine gestational sac: Single

Yolk sac:  Not visualized

Embryo:  Possible early embryo.

Cardiac Activity: Not visualized

Heart Rate:  bpm

MSD:   mm    w     d

CRL:   4.2 mm   6 w 1 d                  [place] EDC: 11/19/2020

Subchorionic hemorrhage:  None visualized.

Maternal uterus/adnexae: No adnexal mass or free fluid.
IMPRESSION: Intrauterine gestation with probable early fetal pole, 6 weeks 1 day
by crown-rump length. No fetal heart tones detected currently,
likely related to early gestational age. This could be followed with
repeat ultrasound in 10-14 days to ensure expected progression.
